# Patient Record
Sex: Male | Born: 1960 | Race: White | Hispanic: No | Marital: Married | Smoking: Never smoker
Health system: Southern US, Community
[De-identification: ages and names within clinical notes are randomized; demographics above are authoritative.]

## PROBLEM LIST (undated history)

## (undated) DIAGNOSIS — M545 Low back pain, unspecified: Secondary | ICD-10-CM

## (undated) HISTORY — DX: Low back pain, unspecified: M54.50

## (undated) HISTORY — PX: POLYPECTOMY: SHX149

## (undated) HISTORY — PX: WISDOM TOOTH EXTRACTION: SHX21

---

## 2004-06-13 ENCOUNTER — Ambulatory Visit (HOSPITAL_COMMUNITY): Admission: RE | Admit: 2004-06-13 | Discharge: 2004-06-13 | Payer: Self-pay | Admitting: Internal Medicine

## 2007-04-28 ENCOUNTER — Ambulatory Visit: Payer: Self-pay | Admitting: Family Medicine

## 2007-11-14 ENCOUNTER — Telehealth: Payer: Self-pay | Admitting: Internal Medicine

## 2008-01-10 ENCOUNTER — Emergency Department (HOSPITAL_COMMUNITY): Admission: EM | Admit: 2008-01-10 | Discharge: 2008-01-10 | Payer: Self-pay | Admitting: Emergency Medicine

## 2008-05-24 ENCOUNTER — Ambulatory Visit: Payer: Self-pay | Admitting: Internal Medicine

## 2008-05-24 ENCOUNTER — Telehealth: Payer: Self-pay | Admitting: Internal Medicine

## 2008-05-24 DIAGNOSIS — H6980 Other specified disorders of Eustachian tube, unspecified ear: Secondary | ICD-10-CM

## 2008-05-24 DIAGNOSIS — T6391XA Toxic effect of contact with unspecified venomous animal, accidental (unintentional), initial encounter: Secondary | ICD-10-CM | POA: Insufficient documentation

## 2009-08-20 ENCOUNTER — Telehealth: Payer: Self-pay | Admitting: Internal Medicine

## 2009-08-30 ENCOUNTER — Ambulatory Visit: Payer: Self-pay | Admitting: Internal Medicine

## 2009-08-30 DIAGNOSIS — L02619 Cutaneous abscess of unspecified foot: Secondary | ICD-10-CM | POA: Insufficient documentation

## 2009-08-30 DIAGNOSIS — L03039 Cellulitis of unspecified toe: Secondary | ICD-10-CM

## 2009-08-30 DIAGNOSIS — L600 Ingrowing nail: Secondary | ICD-10-CM | POA: Insufficient documentation

## 2010-12-07 HISTORY — PX: COLONOSCOPY: SHX174

## 2011-01-22 ENCOUNTER — Telehealth: Payer: Self-pay | Admitting: Internal Medicine

## 2011-01-22 NOTE — Telephone Encounter (Signed)
Has not been seen in over 1 year- h e needs to make an appointment for a physical and will give referral for colonoscopy then

## 2011-01-22 NOTE — Telephone Encounter (Signed)
Pt called and is req to get a colonoscopy scheduled. Pls call.

## 2011-01-27 NOTE — Telephone Encounter (Signed)
[email or web address]

## 2011-02-20 ENCOUNTER — Encounter (INDEPENDENT_AMBULATORY_CARE_PROVIDER_SITE_OTHER): Payer: Self-pay | Admitting: *Deleted

## 2011-02-20 ENCOUNTER — Other Ambulatory Visit: Payer: BC Managed Care – PPO

## 2011-02-20 ENCOUNTER — Other Ambulatory Visit: Payer: Self-pay | Admitting: Internal Medicine

## 2011-02-20 DIAGNOSIS — Z0389 Encounter for observation for other suspected diseases and conditions ruled out: Secondary | ICD-10-CM

## 2011-02-20 DIAGNOSIS — Z0000 Encounter for general adult medical examination without abnormal findings: Secondary | ICD-10-CM

## 2011-02-20 LAB — CBC WITH DIFFERENTIAL/PLATELET
Basophils Absolute: 0 10*3/uL (ref 0.0–0.1)
Eosinophils Absolute: 0.1 10*3/uL (ref 0.0–0.7)
Eosinophils Relative: 1.8 % (ref 0.0–5.0)
HCT: 44.5 % (ref 39.0–52.0)
Hemoglobin: 15.1 g/dL (ref 13.0–17.0)
Lymphocytes Relative: 42.9 % (ref 12.0–46.0)
MCHC: 33.9 g/dL (ref 30.0–36.0)
Monocytes Relative: 7 % (ref 3.0–12.0)
Neutro Abs: 1.9 10*3/uL (ref 1.4–7.7)
Neutrophils Relative %: 48 % (ref 43.0–77.0)

## 2011-02-20 LAB — URINALYSIS
Bilirubin Urine: NEGATIVE
Hgb urine dipstick: NEGATIVE
Ketones, ur: NEGATIVE
Specific Gravity, Urine: 1.01 (ref 1.000–1.030)
Total Protein, Urine: NEGATIVE
Urine Glucose: NEGATIVE
pH: 6 (ref 5.0–8.0)

## 2011-02-20 LAB — TSH: TSH: 1.4 u[IU]/mL (ref 0.35–5.50)

## 2011-02-20 LAB — BASIC METABOLIC PANEL
BUN: 11 mg/dL (ref 6–23)
CO2: 32 mEq/L (ref 19–32)
Chloride: 102 mEq/L (ref 96–112)
Creatinine, Ser: 1.1 mg/dL (ref 0.4–1.5)
Glucose, Bld: 83 mg/dL (ref 70–99)
Potassium: 4.2 mEq/L (ref 3.5–5.1)

## 2011-02-20 LAB — LIPID PANEL: Triglycerides: 137 mg/dL (ref 0.0–149.0)

## 2011-02-20 LAB — PSA: PSA: 0.77 ng/mL (ref 0.10–4.00)

## 2011-02-20 LAB — HEPATIC FUNCTION PANEL
ALT: 28 U/L (ref 0–53)
Total Bilirubin: 0.6 mg/dL (ref 0.3–1.2)

## 2011-02-26 ENCOUNTER — Encounter: Payer: Self-pay | Admitting: Internal Medicine

## 2011-02-27 ENCOUNTER — Ambulatory Visit (INDEPENDENT_AMBULATORY_CARE_PROVIDER_SITE_OTHER): Payer: BC Managed Care – PPO | Admitting: Internal Medicine

## 2011-02-27 ENCOUNTER — Encounter: Payer: Self-pay | Admitting: Internal Medicine

## 2011-02-27 DIAGNOSIS — Z23 Encounter for immunization: Secondary | ICD-10-CM

## 2011-02-27 DIAGNOSIS — Z136 Encounter for screening for cardiovascular disorders: Secondary | ICD-10-CM

## 2011-02-27 DIAGNOSIS — Z0000 Encounter for general adult medical examination without abnormal findings: Secondary | ICD-10-CM

## 2011-02-27 NOTE — Progress Notes (Signed)
  Subjective:    Patient ID: Francisco Hester, male    DOB: 14-Mar-1961, 50 y.o.   MRN: [id number]  HPI patient is a healthy 50 year old white male presents for routine physical examination he has no complaints other than occasional hemorrhoids and his problem list is significant only for eustachian tube dysfunction from chronic allergic rhinitis    Review of Systems  Constitutional: Negative for fever and fatigue.  HENT: Negative for hearing loss, congestion, neck pain and postnasal drip.   Eyes: Negative for discharge, redness and visual disturbance.  Respiratory: Negative for cough, shortness of breath and wheezing.   Cardiovascular: Negative for leg swelling.  Gastrointestinal: Negative for abdominal pain, constipation and abdominal distention.  Genitourinary: Negative for urgency and frequency.  Musculoskeletal: Negative for joint swelling and arthralgias.  Skin: Negative for color change and rash.  Neurological: Negative for weakness and light-headedness.  Hematological: Negative for adenopathy.  Psychiatric/Behavioral: Negative for behavioral problems.   Past Medical History  Diagnosis Date  . Hemorrhoids    Past Surgical History  Procedure Date  . Excisional hemorrhoidectomy     reports that he has never smoked. He does not have any smokeless tobacco history on file. He reports that he does not drink alcohol or use illicit drugs. family history includes COPD in his brother and mother and Dementia in his father. Not on File     Objective:   Physical Exam  Constitutional: He is oriented to person, place, and time. He appears well-developed and well-nourished.  HENT:  Head: Normocephalic and atraumatic.  Eyes: Conjunctivae are normal. Pupils are equal, round, and reactive to light.  Neck: Normal range of motion. Neck supple.  Cardiovascular: Normal rate and regular rhythm.   Pulmonary/Chest: Effort normal and breath sounds normal.  Abdominal: Soft. Bowel sounds are  normal.  Genitourinary: Prostate normal and penis normal.        Mild internal hemorrhoids  Musculoskeletal: Normal range of motion.  Neurological: He is alert and oriented to person, place, and time.  Skin: Skin is warm and dry.  Psychiatric: His behavior is normal.          Assessment & Plan:   Patient presents for yearly preventative medicine examination.   all immunizations and health maintenance protocols were reviewed with the patient and they are up to date with these protocols.   screening laboratory values were reviewed with the patient including screening of hyperlipidemia PSA renal function and hepatic function.   There medications past medical history social history problem list and allergies were reviewed in detail.   Goals were established with regard to weight loss exercise diet in compliance with medications

## 2011-03-04 ENCOUNTER — Encounter: Payer: Self-pay | Admitting: Gastroenterology

## 2011-03-10 NOTE — Letter (Signed)
Summary: Pre Visit Letter Revised  [facility]  [address] [place], [place] [address]   Phone: [phone]  Fax: [phone]        03/04/2011 MRN: [id number]  HALBERT JESSON [address] [place], [place]  [address]  [place]             Procedure Date: 04-17-11 9:30am           Dr Russella Dar   Direct Colon    Welcome to the Gastroenterology Division at [facility].    You are scheduled to see a nurse for your pre-procedure visit on 04-03-11 at 3:30pm on the 3rd floor at [facility], [address]  We ask that you try to arrive at our office 15 minutes prior to your appointment time to allow for check-in.  Please take a minute to review the attached form.  If you answer "Yes" to one or more of the questions on the first page, we ask that you call the person listed at your earliest opportunity.  If you answer "No" to all of the questions, please complete the rest of the form and bring it to your appointment.    Your nurse visit will consist of discussing your medical and surgical history, your immediate family medical history, and your medications.   If you are unable to list all of your medications on the form, please bring the medication bottles to your appointment and we will list them.  We will need to be aware of both prescribed and over the counter drugs.  We will need to know exact dosage information as well.    Please be prepared to read and sign documents such as consent forms, a financial agreement, and acknowledgement forms.  If necessary, and with your consent, a friend or relative is welcome to sit-in on the nurse visit with you.  Please bring your insurance card so that we may make a copy of it.  If your insurance requires a referral to see a specialist, please bring your referral form from your primary care physician.  No co-pay is required for this nurse visit.     If you cannot keep your appointment, please call [phone] to cancel or reschedule  prior to your appointment date.  This allows [place] the opportunity to schedule an appointment for another patient in need of care.    Thank you for choosing [facility] for your medical needs.  We appreciate the opportunity to care for you.  Please visit [place] at our website  to learn more about our practice.  Sincerely, The Gastroenterology Division

## 2011-03-27 ENCOUNTER — Telehealth: Payer: Self-pay | Admitting: *Deleted

## 2011-03-27 DIAGNOSIS — K649 Unspecified hemorrhoids: Secondary | ICD-10-CM

## 2011-03-27 MED ORDER — HYDROCORTISONE ACE-PRAMOXINE 1-1 % RE CREA: TOPICAL_CREAM | Freq: Two times a day (BID) | RECTAL | Status: AC

## 2011-03-27 NOTE — Telephone Encounter (Signed)
Sent to [facility] pharmacy 

## 2011-03-27 NOTE — Telephone Encounter (Signed)
Needs refill for Analpram HC cream for hemorrhoids.

## 2011-04-03 ENCOUNTER — Ambulatory Visit (AMBULATORY_SURGERY_CENTER): Payer: BC Managed Care – PPO | Admitting: *Deleted

## 2011-04-03 VITALS — Ht 72.0 in | Wt 211.5 lb

## 2011-04-03 DIAGNOSIS — Z1211 Encounter for screening for malignant neoplasm of colon: Secondary | ICD-10-CM

## 2011-04-03 MED ORDER — PEG-KCL-NACL-NASULF-NA ASC-C 100 G PO SOLR: 1.0000 | Freq: Once | ORAL | Status: AC

## 2011-04-17 ENCOUNTER — Ambulatory Visit (AMBULATORY_SURGERY_CENTER): Payer: BC Managed Care – PPO | Admitting: Gastroenterology

## 2011-04-17 ENCOUNTER — Encounter: Payer: Self-pay | Admitting: Gastroenterology

## 2011-04-17 VITALS — BP 140/88 | HR 70 | Temp 98.0°F | Resp 20 | Ht 72.0 in | Wt 211.0 lb

## 2011-04-17 DIAGNOSIS — K648 Other hemorrhoids: Secondary | ICD-10-CM

## 2011-04-17 DIAGNOSIS — D126 Benign neoplasm of colon, unspecified: Secondary | ICD-10-CM

## 2011-04-17 DIAGNOSIS — Z1211 Encounter for screening for malignant neoplasm of colon: Secondary | ICD-10-CM

## 2011-04-17 MED ORDER — SODIUM CHLORIDE 0.9 % IV SOLN: 500.0000 mL | INTRAVENOUS | Status: DC

## 2011-04-17 NOTE — Patient Instructions (Signed)
Please read the handouts given to you by your recovery room nurse   Resume your routine medications.  We will contact you by mail as to when to repeat your colonoscopy in 2 weeks.  If you have any questions or concerns, please call us at [phone]. Thank-you.

## 2011-04-20 ENCOUNTER — Telehealth: Payer: Self-pay | Admitting: *Deleted

## 2011-04-20 NOTE — Telephone Encounter (Signed)
Pt unavailable. Spoke with pt's son, Loyal and was given cell phone number to call. No answer on cell phone, no identifier, no message left.

## 2011-04-24 ENCOUNTER — Encounter: Payer: Self-pay | Admitting: Gastroenterology

## 2011-08-28 LAB — DIFFERENTIAL
Basophils Absolute: 0
Lymphocytes Relative: 2 — ABNORMAL LOW
Lymphs Abs: 0.2 — ABNORMAL LOW
Monocytes Absolute: 0.3
Monocytes Relative: 4
Neutro Abs: 6.5
Neutrophils Relative %: 93 — ABNORMAL HIGH

## 2011-08-28 LAB — COMPREHENSIVE METABOLIC PANEL
ALT: 42
AST: 36
Alkaline Phosphatase: 56
Calcium: 9.4
Chloride: 102
Glucose, Bld: 122 — ABNORMAL HIGH
Sodium: 138
Total Bilirubin: 0.9

## 2011-08-28 LAB — CBC
Hemoglobin: 15.6
MCHC: 35.6
MCV: 89.7
RBC: 4.9
WBC: 6.9

## 2011-08-28 LAB — URINALYSIS, ROUTINE W REFLEX MICROSCOPIC
Glucose, UA: NEGATIVE
Hgb urine dipstick: NEGATIVE
Ketones, ur: NEGATIVE
Nitrite: NEGATIVE
Protein, ur: 30 — AB
Urobilinogen, UA: 0.2

## 2012-02-22 ENCOUNTER — Other Ambulatory Visit (INDEPENDENT_AMBULATORY_CARE_PROVIDER_SITE_OTHER): Payer: BC Managed Care – PPO

## 2012-02-22 ENCOUNTER — Other Ambulatory Visit: Payer: Self-pay | Admitting: *Deleted

## 2012-02-22 ENCOUNTER — Telehealth: Payer: Self-pay | Admitting: *Deleted

## 2012-02-22 DIAGNOSIS — Z0000 Encounter for general adult medical examination without abnormal findings: Secondary | ICD-10-CM

## 2012-02-22 LAB — CBC WITH DIFFERENTIAL/PLATELET
Basophils Absolute: 0 10*3/uL (ref 0.0–0.1)
Basophils Relative: 0.3 % (ref 0.0–3.0)
HCT: 46.1 % (ref 39.0–52.0)
Hemoglobin: 15.8 g/dL (ref 13.0–17.0)
Lymphs Abs: 2 10*3/uL (ref 0.7–4.0)
MCHC: 34.2 g/dL (ref 30.0–36.0)
Monocytes Relative: 8.3 % (ref 3.0–12.0)
Neutro Abs: 2.4 10*3/uL (ref 1.4–7.7)
RBC: 4.98 Mil/uL (ref 4.22–5.81)
RDW: 12.8 % (ref 11.5–14.6)

## 2012-02-22 LAB — LIPID PANEL
HDL: 38.1 mg/dL — ABNORMAL LOW (ref >39.00)
Triglycerides: 166 mg/dL — ABNORMAL HIGH (ref 0.0–149.0)

## 2012-02-22 LAB — BASIC METABOLIC PANEL
Calcium: 9.7 mg/dL (ref 8.4–10.5)
GFR: 68.83 mL/min (ref >60.00)
Potassium: 4.7 mEq/L (ref 3.5–5.1)
Sodium: 140 mEq/L (ref 135–145)

## 2012-02-22 LAB — TSH: TSH: 2.14 u[IU]/mL (ref 0.35–5.50)

## 2012-02-22 LAB — HEPATIC FUNCTION PANEL
ALT: 26 U/L (ref 0–53)
Total Protein: 6.4 g/dL (ref 6.0–8.3)

## 2012-02-22 LAB — PSA: PSA: 0.82 ng/mL (ref 0.10–4.00)

## 2012-02-22 NOTE — Telephone Encounter (Signed)
done

## 2012-02-29 ENCOUNTER — Encounter: Payer: BC Managed Care – PPO | Admitting: Internal Medicine

## 2012-03-14 ENCOUNTER — Ambulatory Visit (INDEPENDENT_AMBULATORY_CARE_PROVIDER_SITE_OTHER): Payer: BC Managed Care – PPO | Admitting: Internal Medicine

## 2012-03-14 ENCOUNTER — Encounter: Payer: Self-pay | Admitting: Internal Medicine

## 2012-03-14 VITALS — BP 144/80 | HR 72 | Temp 98.2°F | Resp 16 | Ht 72.0 in | Wt 208.0 lb

## 2012-03-14 DIAGNOSIS — Z0000 Encounter for general adult medical examination without abnormal findings: Secondary | ICD-10-CM

## 2012-03-14 DIAGNOSIS — E785 Hyperlipidemia, unspecified: Secondary | ICD-10-CM

## 2012-03-14 NOTE — Progress Notes (Signed)
Subjective:    Patient ID: Francisco Hester, male    DOB: July 21, 1961, 51 y.o.   MRN: [id number]  HPI  CPX  Caring for mother and father with memory issues  Review of Systems  Constitutional: Negative for fever and fatigue.  HENT: Negative for hearing loss, congestion, neck pain and postnasal drip.   Eyes: Negative for discharge, redness and visual disturbance.  Respiratory: Negative for cough, shortness of breath and wheezing.   Cardiovascular: Negative for leg swelling.  Gastrointestinal: Negative for abdominal pain, constipation and abdominal distention.  Genitourinary: Negative for urgency and frequency.  Musculoskeletal: Negative for joint swelling and arthralgias.  Skin: Negative for color change and rash.  Neurological: Negative for weakness and light-headedness.  Hematological: Negative for adenopathy.  Psychiatric/Behavioral: Negative for behavioral problems.   Past Medical History  Diagnosis Date  . Hemorrhoids     History   Social History  . Marital Status: Married    Spouse Name: N/A    Number of Children: N/A  . Years of Education: N/A   Occupational History  . Not on file.   Social History Main Topics  . Smoking status: Never Smoker   . Smokeless tobacco: Not on file  . Alcohol Use: No  . Drug Use: No  . Sexually Active: Yes   Other Topics Concern  . Not on file   Social History Narrative  . No narrative on file    Past Surgical History  Procedure Date  . Wisdom tooth extraction     Family History  Problem Relation Age of Onset  . COPD Mother   . Dementia Father   . COPD Brother     No Known Allergies  Current Outpatient Prescriptions on File Prior to Visit  Medication Sig Dispense Refill  . Multiple Vitamin (MULTIVITAMIN) tablet Take 1 tablet by mouth daily.        . Omega-3 Fatty Acids (GNP FISH OIL) 1200 MG CPDR Take 1,200 mg by mouth daily.         Current Facility-Administered Medications on File Prior to Visit  Medication  Dose Route Frequency Provider Last Rate Last Dose  . DISCONTD: 0.9 %  sodium chloride infusion  500 mL Intravenous Continuous Meryl Dare, MD,FACG        BP 144/80  Pulse 72  Temp 98.2 F (36.8 C)  Resp 16  Ht 6' (1.829 m)  Wt 208 lb (94.348 kg)  BMI 28.21 kg/m2       Objective:   Physical Exam  Nursing note and vitals reviewed. Constitutional: He is oriented to person, place, and time. He appears well-developed and well-nourished.  HENT:  Head: Normocephalic and atraumatic.  Eyes: Conjunctivae are normal. Pupils are equal, round, and reactive to light.  Neck: Normal range of motion. Neck supple.  Cardiovascular: Normal rate and regular rhythm.   Pulmonary/Chest: Effort normal and breath sounds normal.  Abdominal: Soft. Bowel sounds are normal.  Genitourinary: Rectum normal and prostate normal.  Musculoskeletal: Normal range of motion.  Neurological: He is alert and oriented to person, place, and time.  Skin: Skin is warm and dry.  Psychiatric: He has a normal mood and affect. His behavior is normal.          Assessment & Plan:   Patient presents for yearly preventative medicine examination.   all immunizations and health maintenance protocols were reviewed with the patient and they are up to date with these protocols.   screening laboratory values were reviewed with the  patient including screening of hyperlipidemia PSA renal function and hepatic function.   There medications past medical history social history problem list and allergies were reviewed in detail.   Goals were established with regard to weight loss exercise diet in compliance with medications This is a mild interval elevation of cholesterol but I believe can be adjusted by diet

## 2012-03-14 NOTE — Patient Instructions (Addendum)
The patient is instructed to continue all medications as prescribed. Schedule followup with check out clerk upon leaving the clinic Cholesterol Control Diet Cholesterol levels in your body are determined significantly by your diet. Cholesterol levels may also be related to heart disease. The following material helps to explain this relationship and discusses what you can do to help keep your heart healthy. Not all cholesterol is bad. Low-density lipoprotein (LDL) cholesterol is the "bad" cholesterol. It may cause fatty deposits to build up inside your arteries. High-density lipoprotein (HDL) cholesterol is "good." It helps to remove the "bad" LDL cholesterol from your blood. Cholesterol is a very important risk factor for heart disease. Other risk factors are high blood pressure, smoking, stress, heredity, and weight. The heart muscle gets its supply of blood through the coronary arteries. If your LDL cholesterol is high and your HDL cholesterol is low, you are at risk for having fatty deposits build up in your coronary arteries. This leaves less room through which blood can flow. Without sufficient blood and oxygen, the heart muscle cannot function properly and you may feel chest pains (angina pectoris). When a coronary artery closes up entirely, a part of the heart muscle may die, causing a heart attack (myocardial infarction). CHECKING CHOLESTEROL When your caregiver sends your blood to a lab to be analyzed for cholesterol, a complete lipid (fat) profile may be done. With this test, the total amount of cholesterol and levels of LDL and HDL are determined. Triglycerides are a type of fat that circulates in the blood and can also be used to determine heart disease risk. The list below describes what the numbers should be: Test: Total Cholesterol.  Less than 200 mg/dl.  Test: LDL "bad cholesterol."  Less than 100 mg/dl.   Less than 70 mg/dl if you are at very high risk of a heart attack or sudden  cardiac death.  Test: HDL "good cholesterol."  Greater than 50 mg/dl for women.   Greater than 40 mg/dl for men.  Test: Triglycerides.  Less than 150 mg/dl.  CONTROLLING CHOLESTEROL WITH DIET Although exercise and lifestyle factors are important, your diet is key. That is because certain foods are known to raise cholesterol and others to lower it. The goal is to balance foods for their effect on cholesterol and more importantly, to replace saturated and trans fat with other types of fat, such as monounsaturated fat, polyunsaturated fat, and omega-3 fatty acids. On average, a person should consume no more than 15 to 17 g of saturated fat daily. Saturated and trans fats are considered "bad" fats, and they will raise LDL cholesterol. Saturated fats are primarily found in animal products such as meats, butter, and cream. However, that does not mean you need to sacrifice all your favorite foods. Today, there are good tasting, low-fat, low-cholesterol substitutes for most of the things you like to eat. Choose low-fat or nonfat alternatives. Choose round or loin cuts of red meat, since these types of cuts are lowest in fat and cholesterol. Chicken (without the skin), fish, veal, and ground [place] breast are excellent choices. Eliminate fatty meats, such as hot dogs and salami. Even shellfish have little or no saturated fat. Have a 3 oz (85 g) portion when you eat lean meat, poultry, or fish. Trans fats are also called "partially hydrogenated oils." They are oils that have been scientifically manipulated so that they are solid at room temperature resulting in a longer shelf life and improved taste and texture of foods in which  they are added. Trans fats are found in stick margarine, some tub margarines, cookies, crackers, and baked goods.  When baking and cooking, oils are an excellent substitute for butter. The monounsaturated oils are especially beneficial since it is believed they lower LDL and raise HDL.  The oils you should avoid entirely are saturated tropical oils, such as coconut and palm.  Remember to eat liberally from food groups that are naturally free of saturated and trans fat, including fish, fruit, vegetables, beans, grains (barley, rice, couscous, bulgur wheat), and pasta (without cream sauces).  IDENTIFYING FOODS THAT LOWER CHOLESTEROL  Soluble fiber may lower your cholesterol. This type of fiber is found in fruits such as apples, vegetables such as broccoli, potatoes, and carrots, legumes such as beans, peas, and lentils, and grains such as barley. Foods fortified with plant sterols (phytosterol) may also lower cholesterol. You should eat at least 2 g per day of these foods for a cholesterol lowering effect.  Read package labels to identify low-saturated fats, trans fats free, and low-fat foods at the supermarket. Select cheeses that have only 2 to 3 g saturated fat per ounce. Use a heart-healthy tub margarine that is free of trans fats or partially hydrogenated oil. When buying baked goods (cookies, crackers), avoid partially hydrogenated oils. Breads and muffins should be made from whole grains (whole-wheat or whole oat flour, instead of "flour" or "enriched flour"). Buy non-creamy canned soups with reduced salt and no added fats.  FOOD PREPARATION TECHNIQUES  Never deep-fry. If you must fry, either stir-fry, which uses very little fat, or use non-stick cooking sprays. When possible, broil, bake, or roast meats, and steam vegetables. Instead of dressing vegetables with butter or margarine, use lemon and herbs, applesauce and cinnamon (for squash and sweet potatoes), nonfat yogurt, salsa, and low-fat dressings for salads.  LOW-SATURATED FAT / LOW-FAT FOOD SUBSTITUTES Meats / Saturated Fat (g)  Avoid: Steak, marbled (3 oz/85 g) / 11 g   Choose: Steak, lean (3 oz/85 g) / 4 g   Avoid: Hamburger (3 oz/85 g) / 7 g   Choose: Hamburger, lean (3 oz/85 g) / 5 g   Avoid: Ham (3 oz/85 g) / 6 g     Choose: Ham, lean cut (3 oz/85 g) / 2.4 g   Avoid: Chicken, with skin, dark meat (3 oz/85 g) / 4 g   Choose: Chicken, skin removed, dark meat (3 oz/85 g) / 2 g   Avoid: Chicken, with skin, light meat (3 oz/85 g) / 2.5 g   Choose: Chicken, skin removed, light meat (3 oz/85 g) / 1 g  Dairy / Saturated Fat (g)  Avoid: Whole milk (1 cup) / 5 g   Choose: Low-fat milk, 2% (1 cup) / 3 g   Choose: Low-fat milk, 1% (1 cup) / 1.5 g   Choose: Skim milk (1 cup) / 0.3 g   Avoid: Hard cheese (1 oz/28 g) / 6 g   Choose: Skim milk cheese (1 oz/28 g) / 2 to 3 g   Avoid: Cottage cheese, 4% fat (1 cup) / 6.5 g   Choose: Low-fat cottage cheese, 1% fat (1 cup) / 1.5 g   Avoid: Ice cream (1 cup) / 9 g   Choose: Sherbet (1 cup) / 2.5 g   Choose: Nonfat frozen yogurt (1 cup) / 0.3 g   Choose: Frozen fruit bar / trace   Avoid: Whipped cream (1 tbs) / 3.5 g   Choose: Nondairy whipped topping (1 tbs) /  1 g  Condiments / Saturated Fat (g)  Avoid: Mayonnaise (1 tbs) / 2 g   Choose: Low-fat mayonnaise (1 tbs) / 1 g   Avoid: Butter (1 tbs) / 7 g   Choose: Extra light margarine (1 tbs) / 1 g   Avoid: Coconut oil (1 tbs) / 11.8 g   Choose: Olive oil (1 tbs) / 1.8 g   Choose: Corn oil (1 tbs) / 1.7 g   Choose: Safflower oil (1 tbs) / 1.2 g   Choose: Sunflower oil (1 tbs) / 1.4 g   Choose: Soybean oil (1 tbs) / 2.4 g   Choose: Canola oil (1 tbs) / 1 g  Document Released: 11/23/2005 Document Revised: 11/12/2011 Document Reviewed: 05/14/2011 [facility] Patient Information 2012 [place], [place].

## 2012-09-13 ENCOUNTER — Other Ambulatory Visit: Payer: BC Managed Care – PPO

## 2012-09-19 ENCOUNTER — Ambulatory Visit: Payer: BC Managed Care – PPO | Admitting: Internal Medicine

## 2013-01-16 ENCOUNTER — Telehealth: Payer: Self-pay | Admitting: Internal Medicine

## 2013-01-16 NOTE — Telephone Encounter (Signed)
Patient Information:  Caller Name: T[place]on  Phone: [phone]  Patient: Francisco Hester, Francisco Hester  Gender: Male  DOB: 1961-10-21  Age: 52 Years  PCP: Darryll Capers (Adults only)  Office Follow Up:  Does the office need to follow up with this patient?: No  Instructions For The Office: N/A  RN Note:  Rash with pink bumps and scaly skin that is itchy to circumcised area of penis and scrotal area.  minimal to no improvement with Lotrimen cream.  Symptoms  Reason For Call & Symptoms: Itching and burning in genital area with scaly area  Reviewed Health History In EMR: Yes  Reviewed Medications In EMR: Yes  Reviewed Allergies In EMR: Yes  Reviewed Surgeries / Procedures: Yes  Date of Onset of Symptoms: 12/26/2012  Treatments Tried: Lotrimen  Treatments Tried Worked: Yes  Guideline(s) Used:  Jock Itch  Penis and Scrotum Symptoms  Disposition Per Guideline:   See Today or Tomorrow in Office  Reason For Disposition Reached:   Painless rash (e.g., redness, tiny bumps, sore) present > 24 hours  Advice Given:  Causes of Mild Rash:  Irritation from a chemical product: perfumed soaps, latex condoms.  Small friction burns can occur from intercourse (if inadequate lubrication).  Call Back If:  You become worse.  Appointment Scheduled:  01/17/2013 11:15:00 Appointment Scheduled Provider:  Adline Mango [facility])

## 2013-01-17 ENCOUNTER — Encounter: Payer: Self-pay | Admitting: Family

## 2013-01-17 ENCOUNTER — Ambulatory Visit (INDEPENDENT_AMBULATORY_CARE_PROVIDER_SITE_OTHER): Payer: BC Managed Care – PPO | Admitting: Family

## 2013-01-17 VITALS — BP 128/82 | HR 61 | Wt 193.0 lb

## 2013-01-17 DIAGNOSIS — B356 Tinea cruris: Secondary | ICD-10-CM

## 2013-01-17 MED ORDER — CLOTRIMAZOLE-BETAMETHASONE 1-0.05 % EX CREA: TOPICAL_CREAM | Freq: Two times a day (BID) | CUTANEOUS | Status: DC

## 2013-01-17 NOTE — Progress Notes (Signed)
  Subjective:    Patient ID: Francisco Hester, male    DOB: 07/22/1961, 52 y.o.   MRN: [id number]  HPI 52 year old white male, nonsmoker, patient of Dr. Lovell Sheehan is in today with complaints of a rash on his penis x4 weeks that he describes as itchy, red, and burns. Appears raw. Has been applying Neosporin and Lotrimin inconsistently and is not really sure that helped. Patient noticed the rash after having intercourse or lubrication was a problem. 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   Review of Systems  Constitutional: Negative.   Respiratory: Negative.   Cardiovascular: Negative.   Genitourinary: Negative.  Negative for dysuria.  Musculoskeletal: Negative.   Skin: Positive for rash.       Rash on penis  Psychiatric/Behavioral: Negative.    Past Medical History  Diagnosis Date  . Hemorrhoids     History   Social History  . Marital Status: Married    Spouse Name: N/A    Number of Children: N/A  . Years of Education: N/A   Occupational History  . Not on file.   Social History Main Topics  . Smoking status: Never Smoker   . Smokeless tobacco: Not on file  . Alcohol Use: No  . Drug Use: No  . Sexually Active: Yes   Other Topics Concern  . Not on file   Social History Narrative  . No narrative on file    Past Surgical History  Procedure Laterality Date  . Wisdom tooth extraction      Family History  Problem Relation Age of Onset  . COPD Mother   . Dementia Father   . COPD Brother     No Known Allergies  Current Outpatient Prescriptions on File Prior to Visit  Medication Sig Dispense Refill  . Multiple Vitamin (MULTIVITAMIN) tablet Take 1 tablet by mouth daily.        . Omega-3 Fatty Acids (GNP FISH OIL) 1200 MG CPDR Take 1,200 mg by mouth daily.         No current facility-administered medications on file prior to visit.    BP 128/82  Pulse 61  Wt 193 lb (87.544 kg)  BMI 26.17 kg/m2  SpO2 97%chart    Objective:   Physical Exam   Constitutional: He is oriented to person, place, and time. He appears well-developed and well-nourished.  Cardiovascular: Normal rate, regular rhythm and normal heart sounds.   Pulmonary/Chest: Effort normal and breath sounds normal.  Neurological: He is alert and oriented to person, place, and time.  Skin: Skin is warm and dry. Rash noted.  Red, waxy appearing rash noted to the distal aspect of the penis. Mildly tender to touch. No drainage or discharge from the penis. No lymphadenopathy.  Psychiatric: He has a normal mood and affect.          Assessment & Plan:  Assessment: 1. Tinea of the skin  Plan: Lotrisone cream applied to the affected area twice a day. Keep area clean and dry. Patient advised to call the office if symptoms worsen or persist. Recheck a schedule, and as needed.

## 2013-01-17 NOTE — Patient Instructions (Addendum)
Yeast Infection of the Skin Some yeast on the skin is normal, but sometimes it causes an infection. If you have a yeast infection, it shows up as white or light brown patches on brown skin. You can see it better in the summer on tan skin. It causes light-colored holes in your suntan. It can happen on any area of the body. This cannot be passed from person to person. HOME CARE  Scrub your skin daily with a dandruff shampoo. Your rash may take a couple weeks to get well.  Do not scratch or itch the rash. GET HELP RIGHT AWAY IF:   You get another infection from scratching. The skin may get warm, red, and may ooze fluid.  The infection does not seem to be getting better. MAKE SURE YOU:  Understand these instructions.  Will watch your condition.  Will get help right away if you are not doing well or get worse. Document Released: 11/05/2008 Document Revised: 02/15/2012 Document Reviewed: 11/05/2008 [facility] Patient Information 2013 [place], [place].

## 2013-05-02 ENCOUNTER — Other Ambulatory Visit (INDEPENDENT_AMBULATORY_CARE_PROVIDER_SITE_OTHER): Payer: BC Managed Care – PPO

## 2013-05-02 DIAGNOSIS — E785 Hyperlipidemia, unspecified: Secondary | ICD-10-CM

## 2013-05-02 DIAGNOSIS — Z0000 Encounter for general adult medical examination without abnormal findings: Secondary | ICD-10-CM

## 2013-05-02 LAB — BASIC METABOLIC PANEL
BUN: 15 mg/dL (ref 6–23)
Chloride: 102 mEq/L (ref 96–112)
Glucose, Bld: 80 mg/dL (ref 70–99)
Potassium: 4.1 mEq/L (ref 3.5–5.1)
Sodium: 139 mEq/L (ref 135–145)

## 2013-05-02 LAB — POCT URINALYSIS DIPSTICK
Bilirubin, UA: NEGATIVE
Blood, UA: NEGATIVE
Glucose, UA: NEGATIVE
Ketones, UA: NEGATIVE
Spec Grav, UA: <=1.005

## 2013-05-02 LAB — HEPATIC FUNCTION PANEL
ALT: 26 U/L (ref 0–53)
AST: 26 U/L (ref 0–37)
Albumin: 4.1 g/dL (ref 3.5–5.2)
Alkaline Phosphatase: 49 U/L (ref 39–117)

## 2013-05-02 LAB — TSH: TSH: 1.31 u[IU]/mL (ref 0.35–5.50)

## 2013-05-02 LAB — CBC WITH DIFFERENTIAL/PLATELET
Basophils Relative: 0.4 % (ref 0.0–3.0)
Eosinophils Relative: 1.6 % (ref 0.0–5.0)
HCT: 43.7 % (ref 39.0–52.0)
Hemoglobin: 15 g/dL (ref 13.0–17.0)
Lymphs Abs: 1.6 10*3/uL (ref 0.7–4.0)
MCV: 91.8 fl (ref 78.0–100.0)
Monocytes Absolute: 0.3 10*3/uL (ref 0.1–1.0)
Monocytes Relative: 7 % (ref 3.0–12.0)
Neutro Abs: 2.1 10*3/uL (ref 1.4–7.7)
Platelets: 123 10*3/uL — ABNORMAL LOW (ref 150.0–400.0)
WBC: 4 10*3/uL — ABNORMAL LOW (ref 4.5–10.5)

## 2013-05-02 LAB — LIPID PANEL
Cholesterol: 169 mg/dL (ref 0–200)
LDL Cholesterol: 109 mg/dL — ABNORMAL HIGH (ref 0–99)

## 2013-05-08 ENCOUNTER — Ambulatory Visit (INDEPENDENT_AMBULATORY_CARE_PROVIDER_SITE_OTHER): Payer: BC Managed Care – PPO | Admitting: Internal Medicine

## 2013-05-08 ENCOUNTER — Encounter: Payer: Self-pay | Admitting: Internal Medicine

## 2013-05-08 VITALS — BP 144/86 | HR 68 | Temp 98.2°F | Resp 16 | Ht 72.0 in | Wt 196.0 lb

## 2013-05-08 DIAGNOSIS — Z0000 Encounter for general adult medical examination without abnormal findings: Secondary | ICD-10-CM

## 2013-05-08 NOTE — Progress Notes (Signed)
Subjective:    Patient ID: Francisco Hester, male    DOB: [birth year], 52 y.o.   MRN: [id number]  HPI  CPX  Review of Systems  Constitutional: Negative for fever and fatigue.  HENT: Negative for hearing loss, congestion, neck pain and postnasal drip.   Eyes: Negative for discharge, redness and visual disturbance.  Respiratory: Negative for cough, shortness of breath and wheezing.   Cardiovascular: Negative for leg swelling.  Gastrointestinal: Negative for abdominal pain, constipation and abdominal distention.  Genitourinary: Negative for urgency and frequency.  Musculoskeletal: Negative for joint swelling and arthralgias.  Skin: Negative for color change and rash.  Neurological: Negative for weakness and light-headedness.  Hematological: Negative for adenopathy.  Psychiatric/Behavioral: Negative for behavioral problems.       Past Medical History  Diagnosis Date  . Hemorrhoids     History   Social History  . Marital Status: Married    Spouse Name: N/A    Number of Children: N/A  . Years of Education: N/A   Occupational History  . Not on file.   Social History Main Topics  . Smoking status: Never Smoker   . Smokeless tobacco: Not on file  . Alcohol Use: No  . Drug Use: No  . Sexually Active: Yes   Other Topics Concern  . Not on file   Social History Narrative  . No narrative on file    Past Surgical History  Procedure Laterality Date  . Wisdom tooth extraction      Family History  Problem Relation Age of Onset  . COPD Mother   . Dementia Father   . COPD Brother     No Known Allergies  Current Outpatient Prescriptions on File Prior to Visit  Medication Sig Dispense Refill  . clotrimazole-betamethasone (LOTRISONE) cream Apply topically 2 (two) times daily.  30 g  0  . Omega-3 Fatty Acids (GNP FISH OIL) 1200 MG CPDR Take 1,200 mg by mouth daily.        . Multiple Vitamin (MULTIVITAMIN) tablet Take 1 tablet by mouth daily.         No current  facility-administered medications on file prior to visit.    BP 144/86  Pulse 68  Temp(Src) 98.2 F (36.8 C)  Resp 16  Ht 6' (1.829 m)  Wt 196 lb (88.905 kg)  BMI 26.58 kg/m2    Objective:   Physical Exam  Nursing note and vitals reviewed. Constitutional: He is oriented to person, place, and time. He appears well-developed and well-nourished.  HENT:  Head: Normocephalic and atraumatic.  Eyes: Conjunctivae are normal. Pupils are equal, round, and reactive to light.  Neck: Normal range of motion. Neck supple.  Cardiovascular: Normal rate and regular rhythm.   Pulmonary/Chest: Effort normal and breath sounds normal.  Abdominal: Soft. Bowel sounds are normal.  Neurological: He is alert and oriented to person, place, and time.  Skin: Skin is warm and dry.  Psychiatric: He has a normal mood and affect. His behavior is normal.          Assessment & Plan:    Patient presents for yearly preventative medicine examination.   all immunizations and health maintenance protocols were reviewed with the patient and they are up to date with these protocols.   screening laboratory values were reviewed with the patient including screening of hyperlipidemia PSA renal function and hepatic function.   There medications past medical history social history problem list and allergies were reviewed in detail.   Goals were established  with regard to weight loss exercise diet in compliance with medications

## 2013-10-12 ENCOUNTER — Other Ambulatory Visit: Payer: Self-pay

## 2014-01-03 ENCOUNTER — Telehealth: Payer: Self-pay | Admitting: Internal Medicine

## 2014-01-03 NOTE — Telephone Encounter (Signed)
Patient Information:  Caller Name: Otoniel  Phone: [phone]  Patient: Francisco Hester, Francisco Hester  Gender: Male  DOB: 15-May-1961  Age: 53 Years  PCP: Benay Pillow (Adults only)  Office Follow Up:  Does the office need to follow up with this patient?: No  Instructions For The Office: N/A   Symptoms  Reason For Call & Symptoms: Pt calling asking for an anitbiotic for sinus congestion and pain for "several weeks now". Afebrile. Not treating. Explained that antibiotic could not be called in w/out appt.l  Reviewed Health History In EMR: Yes  Reviewed Medications In EMR: Yes  Reviewed Allergies In EMR: Yes  Reviewed Surgeries / Procedures: Yes  Date of Onset of Symptoms: 01/03/2014  Guideline(s) Used:  Sinus Pain and Congestion  Disposition Per Guideline:   See Today in Office  Reason For Disposition Reached:   Sinus pain (not just congestion) and fever  Advice Given:  N/A  Patient Will Follow Care Advice:  YES  Appointment Scheduled: No appts avail for 01/03/14.   01/04/2014 15:30:00 Appointment Scheduled Provider:  Carolann Littler (Family Practice)

## 2014-01-04 ENCOUNTER — Encounter: Payer: Self-pay | Admitting: Family Medicine

## 2014-01-04 ENCOUNTER — Ambulatory Visit (INDEPENDENT_AMBULATORY_CARE_PROVIDER_SITE_OTHER): Payer: BC Managed Care – PPO | Admitting: Family Medicine

## 2014-01-04 VITALS — BP 128/90 | HR 60 | Temp 97.7°F | Wt 210.0 lb

## 2014-01-04 DIAGNOSIS — J019 Acute sinusitis, unspecified: Secondary | ICD-10-CM

## 2014-01-04 MED ORDER — AZITHROMYCIN 250 MG PO TABS: ORAL_TABLET | ORAL | Status: AC

## 2014-01-04 NOTE — Progress Notes (Signed)
Pre visit review using our clinic review tool, if applicable. No additional management support is needed unless otherwise documented below in the visit note. 

## 2014-01-04 NOTE — Progress Notes (Signed)
   Subjective:    Patient ID: Francisco Hester, male    DOB: 1961-08-29, 53 y.o.   MRN: [id number]  HPI Patient seen with 3 week history of right facial pain maxillary and frontal region. Had some right ear pressure. Increased malaise. Yellow-green nasal discharge. Denies any fever. Intermittent mild headaches. Denies any cough. He is continued exercise. He's tried saline nasal irrigation without improvement  Past Medical History  Diagnosis Date  . Hemorrhoids    Past Surgical History  Procedure Laterality Date  . Wisdom tooth extraction      reports that he has never smoked. He does not have any smokeless tobacco history on file. He reports that he does not drink alcohol or use illicit drugs. family history includes COPD in his brother and mother; Dementia in his father. No Known Allergies      Review of Systems  Constitutional: Negative for fever and chills.  HENT: Positive for congestion and sinus pressure.   Respiratory: Negative for cough.        Objective:   Physical Exam  Constitutional: He appears well-developed and well-nourished.  HENT:  Right Ear: External ear normal.  Left Ear: External ear normal.  Mouth/Throat: Oropharynx is clear and moist.  Erythematous nasal mucosa. Thick yellow mucus bilaterally  Neck: Neck supple.  Cardiovascular: Normal rate.   Pulmonary/Chest: Effort normal and breath sounds normal. No respiratory distress. He has no wheezes. He has no rales.  Lymphadenopathy:    He has no cervical adenopathy.          Assessment & Plan:  Acute sinusitis. Given duration of symptoms, start Zithromax for 5 days. Try humidifier at night. Continue saline nasal irrigation

## 2014-05-04 ENCOUNTER — Other Ambulatory Visit (INDEPENDENT_AMBULATORY_CARE_PROVIDER_SITE_OTHER): Payer: BC Managed Care – PPO

## 2014-05-04 DIAGNOSIS — Z0000 Encounter for general adult medical examination without abnormal findings: Secondary | ICD-10-CM

## 2014-05-04 LAB — BASIC METABOLIC PANEL
BUN: 17 mg/dL (ref 6–23)
CO2: 32 meq/L (ref 19–32)
CREATININE: 1.1 mg/dL (ref 0.4–1.5)
Calcium: 9.6 mg/dL (ref 8.4–10.5)
Chloride: 101 mEq/L (ref 96–112)
GFR: 76.42 mL/min (ref >60.00)
Glucose, Bld: 77 mg/dL (ref 70–99)
Potassium: 4.1 mEq/L (ref 3.5–5.1)
Sodium: 139 mEq/L (ref 135–145)

## 2014-05-04 LAB — CBC WITH DIFFERENTIAL/PLATELET
Basophils Absolute: 0 10*3/uL (ref 0.0–0.1)
Basophils Relative: 0.4 % (ref 0.0–3.0)
EOS ABS: 0 10*3/uL (ref 0.0–0.7)
Eosinophils Relative: 1.1 % (ref 0.0–5.0)
HCT: 44.9 % (ref 39.0–52.0)
Hemoglobin: 15.2 g/dL (ref 13.0–17.0)
LYMPHS PCT: 37.3 % (ref 12.0–46.0)
Lymphs Abs: 1.3 10*3/uL (ref 0.7–4.0)
MCHC: 33.8 g/dL (ref 30.0–36.0)
MCV: 91.9 fl (ref 78.0–100.0)
Monocytes Absolute: 0.3 10*3/uL (ref 0.1–1.0)
Monocytes Relative: 7.4 % (ref 3.0–12.0)
NEUTROS PCT: 53.8 % (ref 43.0–77.0)
Neutro Abs: 1.9 10*3/uL (ref 1.4–7.7)
PLATELETS: 133 10*3/uL — AB (ref 150.0–400.0)
RBC: 4.89 Mil/uL (ref 4.22–5.81)
RDW: 12.9 % (ref 11.5–15.5)
WBC: 3.5 10*3/uL — ABNORMAL LOW (ref 4.0–10.5)

## 2014-05-04 LAB — HEPATIC FUNCTION PANEL
ALK PHOS: 52 U/L (ref 39–117)
ALT: 46 U/L (ref 0–53)
AST: 29 U/L (ref 0–37)
Albumin: 4.3 g/dL (ref 3.5–5.2)
BILIRUBIN DIRECT: 0.1 mg/dL (ref 0.0–0.3)
BILIRUBIN TOTAL: 0.9 mg/dL (ref 0.2–1.2)
Total Protein: 6.3 g/dL (ref 6.0–8.3)

## 2014-05-04 LAB — POCT URINALYSIS DIPSTICK
BILIRUBIN UA: NEGATIVE
Blood, UA: NEGATIVE
Glucose, UA: NEGATIVE
KETONES UA: NEGATIVE
LEUKOCYTES UA: NEGATIVE
Nitrite, UA: NEGATIVE
Protein, UA: NEGATIVE
Spec Grav, UA: 1.01
Urobilinogen, UA: 0.2
pH, UA: 7

## 2014-05-04 LAB — PSA: PSA: 1.15 ng/mL (ref 0.10–4.00)

## 2014-05-04 LAB — TSH: TSH: 1.73 u[IU]/mL (ref 0.35–4.50)

## 2014-05-04 LAB — LIPID PANEL
Cholesterol: 187 mg/dL (ref 0–200)
HDL: 45 mg/dL (ref >39.00)
LDL Cholesterol: 126 mg/dL — ABNORMAL HIGH (ref 0–99)
Total CHOL/HDL Ratio: 4
Triglycerides: 78 mg/dL (ref 0.0–149.0)
VLDL: 15.6 mg/dL (ref 0.0–40.0)

## 2014-05-11 ENCOUNTER — Encounter: Payer: Self-pay | Admitting: Internal Medicine

## 2014-05-11 ENCOUNTER — Ambulatory Visit (INDEPENDENT_AMBULATORY_CARE_PROVIDER_SITE_OTHER): Payer: BC Managed Care – PPO | Admitting: Internal Medicine

## 2014-05-11 VITALS — BP 180/110 | HR 64 | Temp 98.2°F | Ht 72.25 in | Wt 198.0 lb

## 2014-05-11 DIAGNOSIS — Z0000 Encounter for general adult medical examination without abnormal findings: Secondary | ICD-10-CM

## 2014-05-11 NOTE — Progress Notes (Signed)
Pre visit review using our clinic review tool, if applicable. No additional management support is needed unless otherwise documented below in the visit note. 

## 2014-05-11 NOTE — Patient Instructions (Signed)
The patient is instructed to continue all medications as prescribed. Schedule followup with check out clerk upon leaving the clinic  

## 2014-05-11 NOTE — Progress Notes (Signed)
   Subjective:    Patient ID: Francisco Hester, male    DOB: [birth year], 53 y.o.   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  HENT: Negative for congestion, hearing loss and postnasal drip.   Eyes: Negative for discharge, redness and visual disturbance.  Respiratory: Negative for cough, shortness of breath and wheezing.   Cardiovascular: Negative for leg swelling.  Gastrointestinal: Negative for abdominal pain, constipation and abdominal distention.  Genitourinary: Negative for urgency and frequency.  Musculoskeletal: Negative for arthralgias, joint swelling and neck pain.  Skin: Negative for color change and rash.  Neurological: Negative for weakness and light-headedness.  Hematological: Negative for adenopathy.  Psychiatric/Behavioral: Negative for behavioral problems.  All other systems reviewed and are negative.      Objective:   Physical Exam  Constitutional: He is oriented to person, place, and time. He appears well-developed and well-nourished.  HENT:  Head: Normocephalic and atraumatic.  Eyes: Conjunctivae are normal. Pupils are equal, round, and reactive to light.  Neck: Normal range of motion. Neck supple.  Cardiovascular: Normal rate and regular rhythm.   Pulmonary/Chest: Effort normal and breath sounds normal.  Abdominal: Soft. Bowel sounds are normal.  Musculoskeletal: Normal range of motion.  Neurological: He is alert and oriented to person, place, and time.  Skin: Skin is warm and dry.  Psychiatric: He has a normal mood and affect. His behavior is normal.          Assessment & Plan:  Patient presents for yearly preventative medicine examination. ROS questionnaire was completed  All immunizations and health maintenance  protocols were reviewed with the patient and needed orders were placed.  Appropriate screening laboratory values were ordered for the patient including screening of hyperlipidemia, renal function and hepatic function. If indicated by BPH, a PSA was ordered.  Medication reconciliation,  past medical history, social history, problem list and allergies were reviewed in detail with the patient  Goals were established with regard to weight loss, exercise, and  diet in compliance with medications  End of life planning was discussed.  White coat : repeat BP was 140/77

## 2014-12-15 ENCOUNTER — Emergency Department (HOSPITAL_COMMUNITY): Payer: BLUE CROSS/BLUE SHIELD

## 2014-12-15 ENCOUNTER — Encounter (HOSPITAL_COMMUNITY): Payer: Self-pay | Admitting: Emergency Medicine

## 2014-12-15 ENCOUNTER — Emergency Department (HOSPITAL_COMMUNITY)
Admission: EM | Admit: 2014-12-15 | Discharge: 2014-12-15 | Disposition: A | Discharge status: Home / Self Care | Payer: BLUE CROSS/BLUE SHIELD | Attending: Emergency Medicine | Admitting: Emergency Medicine

## 2014-12-15 DIAGNOSIS — R002 Palpitations: Secondary | ICD-10-CM | POA: Diagnosis present

## 2014-12-15 DIAGNOSIS — Z8719 Personal history of other diseases of the digestive system: Secondary | ICD-10-CM | POA: Diagnosis not present

## 2014-12-15 DIAGNOSIS — F419 Anxiety disorder, unspecified: Secondary | ICD-10-CM | POA: Insufficient documentation

## 2014-12-15 LAB — CBC WITH DIFFERENTIAL/PLATELET
Basophils Absolute: 0 10*3/uL (ref 0.0–0.1)
Basophils Relative: 0 % (ref 0–1)
Eosinophils Absolute: 0.1 10*3/uL (ref 0.0–0.7)
Eosinophils Relative: 1 % (ref 0–5)
HCT: 44.2 % (ref 39.0–52.0)
Hemoglobin: 15.6 g/dL (ref 13.0–17.0)
Lymphocytes Relative: 27 % (ref 12–46)
Lymphs Abs: 1.5 10*3/uL (ref 0.7–4.0)
MCH: 31.3 pg (ref 26.0–34.0)
MCHC: 35.3 g/dL (ref 30.0–36.0)
MCV: 88.6 fL (ref 78.0–100.0)
Monocytes Absolute: 0.3 10*3/uL (ref 0.1–1.0)
Monocytes Relative: 5 % (ref 3–12)
NEUTROS PCT: 67 % (ref 43–77)
Neutro Abs: 3.7 10*3/uL (ref 1.7–7.7)
Platelets: 143 10*3/uL — ABNORMAL LOW (ref 150–400)
RBC: 4.99 MIL/uL (ref 4.22–5.81)
RDW: 12.3 % (ref 11.5–15.5)
WBC: 5.6 10*3/uL (ref 4.0–10.5)

## 2014-12-15 LAB — TROPONIN I

## 2014-12-15 LAB — COMPREHENSIVE METABOLIC PANEL
ALT: 32 U/L (ref 0–53)
ANION GAP: 6 (ref 5–15)
AST: 34 U/L (ref 0–37)
Albumin: 4.9 g/dL (ref 3.5–5.2)
Alkaline Phosphatase: 58 U/L (ref 39–117)
BILIRUBIN TOTAL: 0.7 mg/dL (ref 0.3–1.2)
BUN: 14 mg/dL (ref 6–23)
CO2: 31 mmol/L (ref 19–32)
Calcium: 9.9 mg/dL (ref 8.4–10.5)
Chloride: 105 mEq/L (ref 96–112)
Creatinine, Ser: 1.1 mg/dL (ref 0.50–1.35)
GFR calc non Af Amer: 74 mL/min — ABNORMAL LOW (ref >90)
GFR, EST AFRICAN AMERICAN: 86 mL/min — AB (ref >90)
Glucose, Bld: 101 mg/dL — ABNORMAL HIGH (ref 70–99)
Potassium: 4.1 mmol/L (ref 3.5–5.1)
Sodium: 142 mmol/L (ref 135–145)
TOTAL PROTEIN: 7 g/dL (ref 6.0–8.3)

## 2014-12-15 NOTE — Discharge Instructions (Signed)
Recommend follow-up with cardiologist. Phone number given. Please recheck your blood pressure several times and record in a notebook

## 2014-12-15 NOTE — ED Provider Notes (Signed)
CSN: [id number]     Arrival date & time 12/15/14  1716 History  This chart was scribed for Nat Christen, MD by Randa Evens, ED Scribe. This patient was seen in room APA07/APA07 and the patient's care was started at 10:40 PM.      Chief Complaint  Patient presents with  . Palpitations   Patient is a 54 y.o. male presenting with palpitations. The history is provided by the patient. No language interpreter was used.  Palpitations Associated symptoms: no chest pain, no diaphoresis, no nausea and no shortness of breath    HPI Comments: SHINICHI ANGUIANO is a 54 y.o. male who presents to the Emergency Department complaining of new sudden palpitations onset today around 12 PM. Pt states that he felt slightly nervous. Pt states that his pulse felt erratic. Denies nausea, diaphoresis, SOB, or CP.  Pt states that he is not a smoker. Pt denies family Hx of cardiac diease. Denies use of alcohol. No chronic health problems. No medications.   Past Medical History  Diagnosis Date  . Hemorrhoids    Past Surgical History  Procedure Laterality Date  . Wisdom tooth extraction     Family History  Problem Relation Age of Onset  . COPD Mother   . Dementia Father   . COPD Brother    History  Substance Use Topics  . Smoking status: Never Smoker   . Smokeless tobacco: Not on file  . Alcohol Use: No    Review of Systems  Constitutional: Negative for diaphoresis.  Respiratory: Negative for shortness of breath.   Cardiovascular: Positive for palpitations. Negative for chest pain.  Gastrointestinal: Negative for nausea.  Psychiatric/Behavioral: The patient is nervous/anxious.       Allergies  Review of patient's allergies indicates no known allergies.  Home Medications   Prior to Admission medications   Medication Sig Start Date End Date Taking? Authorizing Provider  Biotin 1 MG CAPS Take 1 tablet by mouth daily.    Yes Historical Provider, MD  ibuprofen (ADVIL,MOTRIN) 200 MG tablet Take  200 mg by mouth every 6 (six) hours as needed for mild pain or moderate pain.   Yes Historical Provider, MD  Omega-3 Fatty Acids (GNP FISH OIL) 1200 MG CPDR Take 1,200 mg by mouth daily.     Yes Historical Provider, MD   Triage Vitals: BP 164/97 mmHg  Pulse 70  Temp(Src) 97.8 F (36.6 C) (Oral)  Resp 20  Ht 6' (1.829 m)  Wt 195 lb (88.451 kg)  BMI 26.44 kg/m2  SpO2 100%  Physical Exam  Constitutional: He is oriented to person, place, and time. He appears well-developed and well-nourished.  HENT:  Head: Normocephalic and atraumatic.  Eyes: Conjunctivae and EOM are normal. Pupils are equal, round, and reactive to light.  Neck: Normal range of motion. Neck supple.  Cardiovascular: Normal rate and regular rhythm.   Pulmonary/Chest: Effort normal and breath sounds normal.  Abdominal: Soft. Bowel sounds are normal.  Musculoskeletal: Normal range of motion.  Neurological: He is alert and oriented to person, place, and time.  Skin: Skin is warm and dry.  Psychiatric: He has a normal mood and affect. His behavior is normal.  Nursing note and vitals reviewed.   ED Course  Procedures (including critical care time) DIAGNOSTIC STUDIES: Oxygen Saturation is 100% on RA, normal by my interpretation.    COORDINATION OF CARE: 10:49 PM-Discussed treatment plan with pt at bedside and pt agreed to plan.     Labs Review Labs Reviewed  CBC WITH DIFFERENTIAL - Abnormal; Notable for the following:    Platelets 143 (*)    All other components within normal limits  COMPREHENSIVE METABOLIC PANEL - Abnormal; Notable for the following:    Glucose, Bld 101 (*)    GFR calc non Af Amer 74 (*)    GFR calc Af Amer 86 (*)    All other components within normal limits  TROPONIN I    Imaging Review Dg Chest 2 View  12/15/2014   CLINICAL DATA:  Palpitations  EXAM: CHEST  2 VIEW  COMPARISON:  None.  FINDINGS: Lungs are clear.  No pleural effusion or pneumothorax.  Heart is normal in size.  Visualized  osseous structures are within normal limits.  IMPRESSION: No evidence of acute cardiopulmonary disease.   Electronically Signed   By: Julian Hy M.D.   On: 12/15/2014 18:49     EKG Interpretation   Date/Time:  Saturday December 15 2014 17:51:47 EST Ventricular Rate:  77 PR Interval:  186 QRS Duration: 88 QT Interval:  400 QTC Calculation: 452 R Axis:   -3 Text Interpretation:  Sinus rhythm with Premature atrial complexes  Anterior infarct , age undetermined Abnormal ECG Confirmed by Yomayra Tate  MD,  Devan Danzer ([id number]) on 12/15/2014 6:02:34 PM      MDM   Final diagnoses:  Palpitations   EKG shows normal sinus rhythm with premature atrial complexes. Cardiac risk factor profile is extremely low. Discussed findings with patient and his wife. Recommend cardiology consult. Patient is hemodynamically stable at discharge   I personally performed the services described in this documentation, which was scribed in my presence. The recorded information has been reviewed and is accurate.      Nat Christen, MD 12/15/14 2330

## 2014-12-15 NOTE — ED Notes (Signed)
PT stated he had some palpitations on and off today starting around lunch time. PT denies any SOB or Chest Pain today.

## 2015-01-02 ENCOUNTER — Encounter: Payer: Self-pay | Admitting: Cardiovascular Disease

## 2015-01-02 ENCOUNTER — Ambulatory Visit (INDEPENDENT_AMBULATORY_CARE_PROVIDER_SITE_OTHER): Payer: BLUE CROSS/BLUE SHIELD | Admitting: Cardiovascular Disease

## 2015-01-02 VITALS — BP 143/94 | HR 80 | Ht 72.0 in | Wt 196.0 lb

## 2015-01-02 DIAGNOSIS — R002 Palpitations: Secondary | ICD-10-CM

## 2015-01-02 NOTE — Patient Instructions (Signed)
Your physician recommends that you schedule a follow-up appointment As needed  Your physician recommends that you continue on your current medications as directed. Please refer to the Current Medication list given to you today.  Thank you for choosing [facility]!    

## 2015-01-02 NOTE — Addendum Note (Signed)
Addended by: Levonne Hubert on: 01/02/2015 02:24 PM   Modules accepted: Level of Service
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
## 2015-02-04 ENCOUNTER — Encounter: Payer: Self-pay | Admitting: Family Medicine

## 2015-02-04 ENCOUNTER — Ambulatory Visit (INDEPENDENT_AMBULATORY_CARE_PROVIDER_SITE_OTHER): Payer: BLUE CROSS/BLUE SHIELD | Admitting: Family Medicine

## 2015-02-04 VITALS — BP 132/88 | Ht 72.25 in | Wt 197.5 lb

## 2015-02-04 DIAGNOSIS — R002 Palpitations: Secondary | ICD-10-CM | POA: Diagnosis not present

## 2015-02-04 DIAGNOSIS — E785 Hyperlipidemia, unspecified: Secondary | ICD-10-CM | POA: Diagnosis not present

## 2015-02-04 NOTE — Progress Notes (Signed)
   Subjective:    Patient ID: Francisco Hester, male    DOB: September 12, 1961, 54 y.o.   MRN: [id number]  HPI Patient is here today to establish care. Patient also wanted to follow up on the palpitations he experienced about 1 month ago. He was seen by cardiology concerning this.    ha intermittent palpitations. No syncope. No tachycardia. Denies sweats chills nausea vomiting. ddenies chest pressure tightness r pain  Review of Systems  Constitutional: Negative for activity change, appetite change and fatigue.  HENT: Negative for congestion.   Respiratory: Negative for cough.   Cardiovascular: Negative for chest pain.  Gastrointestinal: Negative for abdominal pain.  Endocrine: Negative for polydipsia and polyphagia.  Neurological: Negative for weakness.  Psychiatric/Behavioral: Negative for confusion.       Objective:   Physical Exam  Constitutional: He appears well-nourished. No distress.  Cardiovascular: Normal rate, regular rhythm and normal heart sounds.   No murmur heard. Pulmonary/Chest: Effort normal and breath sounds normal. No respiratory distress.  Musculoskeletal: He exhibits no edema.  Lymphadenopathy:    He has no cervical adenopathy.  Neurological: He is alert.  Psychiatric: His behavior is normal.  Vitals reviewed.         Assessment & Plan:   premature atrial contractions there is no need to do any type of the extensive cardiac workup he's Arty seen the cardiologist I reviewed over everything I believe he is doing well his exercise good his diets good he will need comprehensive lab work in the early spring with a follow-up wellness visit in May.  He was counseled to minimize caffeine's in the diet and get proper rest he was also counseled how to check a blood pressure as well as heart rate he will bring his blood pressure monitor with him when he follows up. Should he have any problems he will notify us.

## 2015-02-20 ENCOUNTER — Telehealth: Payer: Self-pay | Admitting: Family Medicine

## 2015-02-20 NOTE — Telephone Encounter (Signed)
Pt would like for you to call him this evening about his father Due to some issues with him at his nursing home

## 2015-02-20 NOTE — Telephone Encounter (Signed)
Pt has been having frequent UTIs. Going to the ER. Is in a wheelchair most of the time. Unable to come to [facility]. They want to transfer him out of [facility] into a skilled living center. Francisco Hester wants your opinion.

## 2015-02-21 NOTE — Telephone Encounter (Signed)
Spoke with son , they will be initiating care with Doctor group at the center

## 2015-04-30 ENCOUNTER — Telehealth: Payer: Self-pay | Admitting: Family Medicine

## 2015-04-30 DIAGNOSIS — R5383 Other fatigue: Secondary | ICD-10-CM

## 2015-04-30 DIAGNOSIS — E785 Hyperlipidemia, unspecified: Secondary | ICD-10-CM

## 2015-04-30 DIAGNOSIS — Z125 Encounter for screening for malignant neoplasm of prostate: Secondary | ICD-10-CM

## 2015-04-30 DIAGNOSIS — Z79899 Other long term (current) drug therapy: Secondary | ICD-10-CM

## 2015-04-30 NOTE — Telephone Encounter (Signed)
Pt is requesting lab orders to be sent over for his June 6th appt. Last labs per epic were: cbc,cmet,and troponin I on 12/15/14

## 2015-05-01 NOTE — Telephone Encounter (Signed)
CBC, metabolic 7, lipid, PSA-hyperlipidemia, neutropenia, screening

## 2015-05-01 NOTE — Telephone Encounter (Signed)
Left message on voicemail notifying patient that blood work has been ordered.  

## 2015-05-05 LAB — BASIC METABOLIC PANEL
BUN/Creatinine Ratio: 11 (ref 9–20)
BUN: 12 mg/dL (ref 6–24)
CALCIUM: 9.9 mg/dL (ref 8.7–10.2)
CO2: 27 mmol/L (ref 18–29)
Chloride: 99 mmol/L (ref 97–108)
Creatinine, Ser: 1.07 mg/dL (ref 0.76–1.27)
GFR calc Af Amer: 90 mL/min/{1.73_m2} (ref >59)
GFR calc non Af Amer: 78 mL/min/{1.73_m2} (ref >59)
Glucose: 94 mg/dL (ref 65–99)
Potassium: 4.6 mmol/L (ref 3.5–5.2)
Sodium: 143 mmol/L (ref 134–144)

## 2015-05-05 LAB — CBC WITH DIFFERENTIAL/PLATELET
Basophils Absolute: 0 10*3/uL (ref 0.0–0.2)
Basos: 1 %
EOS (ABSOLUTE): 0.1 10*3/uL (ref 0.0–0.4)
Eos: 1 %
HEMOGLOBIN: 16.6 g/dL (ref 12.6–17.7)
Hematocrit: 47.3 % (ref 37.5–51.0)
IMMATURE GRANS (ABS): 0 10*3/uL (ref 0.0–0.1)
IMMATURE GRANULOCYTES: 0 %
Lymphocytes Absolute: 1.6 10*3/uL (ref 0.7–3.1)
Lymphs: 40 %
MCH: 31.4 pg (ref 26.6–33.0)
MCHC: 35.1 g/dL (ref 31.5–35.7)
MCV: 90 fL (ref 79–97)
MONOCYTES: 8 %
MONOS ABS: 0.3 10*3/uL (ref 0.1–0.9)
NEUTROS ABS: 2.1 10*3/uL (ref 1.4–7.0)
Neutrophils: 50 %
PLATELETS: 160 10*3/uL (ref 150–379)
RBC: 5.28 x10E6/uL (ref 4.14–5.80)
RDW: 13.3 % (ref 12.3–15.4)
WBC: 4.2 10*3/uL (ref 3.4–10.8)

## 2015-05-05 LAB — LIPID PANEL
Chol/HDL Ratio: 4.1 ratio units (ref 0.0–5.0)
Cholesterol, Total: 193 mg/dL (ref 100–199)
HDL: 47 mg/dL (ref >39)
LDL Calculated: 117 mg/dL — ABNORMAL HIGH (ref 0–99)
Triglycerides: 144 mg/dL (ref 0–149)
VLDL Cholesterol Cal: 29 mg/dL (ref 5–40)

## 2015-05-05 LAB — PSA: Prostate Specific Ag, Serum: 1.5 ng/mL (ref 0.0–4.0)

## 2015-05-13 ENCOUNTER — Encounter: Payer: Self-pay | Admitting: Family Medicine

## 2015-05-13 ENCOUNTER — Ambulatory Visit (INDEPENDENT_AMBULATORY_CARE_PROVIDER_SITE_OTHER): Payer: BLUE CROSS/BLUE SHIELD | Admitting: Family Medicine

## 2015-05-13 VITALS — BP 130/80 | Ht 73.0 in | Wt 195.5 lb

## 2015-05-13 DIAGNOSIS — R358 Other polyuria: Secondary | ICD-10-CM

## 2015-05-13 DIAGNOSIS — Z0000 Encounter for general adult medical examination without abnormal findings: Secondary | ICD-10-CM | POA: Diagnosis not present

## 2015-05-13 DIAGNOSIS — R3589 Other polyuria: Secondary | ICD-10-CM

## 2015-05-13 LAB — POCT URINALYSIS DIPSTICK: pH, UA: 7

## 2015-05-13 NOTE — Progress Notes (Signed)
   Subjective:    Patient ID: Francisco Hester, male    DOB: 1961-11-22, 54 y.o.   MRN: [id number]  HPI The patient comes in today for a wellness visit.    A review of their health history was completed.  A review of medications was also completed.  Any needed refills: none  Eating habits: good   Falls/  MVA accidents in past few months: none   Regular exercise: good  Specialist pt sees on regular basis: none  Preventative health issues were discussed.   Additional concerns: none   Review of Systems  Constitutional: Negative for activity change, appetite change and fatigue.  HENT: Negative for congestion.   Respiratory: Negative for cough.   Cardiovascular: Negative for chest pain.  Gastrointestinal: Negative for abdominal pain.  Endocrine: Negative for polydipsia and polyphagia.  Neurological: Negative for weakness.  Psychiatric/Behavioral: Negative for confusion.       Objective:   Physical Exam  Constitutional: He appears well-nourished. No distress.  Cardiovascular: Normal rate, regular rhythm and normal heart sounds.   No murmur heard. Pulmonary/Chest: Effort normal and breath sounds normal. No respiratory distress.  Musculoskeletal: He exhibits no edema.  Lymphadenopathy:    He has no cervical adenopathy.  Neurological: He is alert.  Psychiatric: His behavior is normal.  Vitals reviewed.   prostate exam completed soft no hard nodules  blood pressure slightly elevated here it is the same on his monitor her  his monitor gets very good readings at home this points toward no hypertension      Assessment & Plan:   safety , dietary all discussed. Mild hyperlipidemia does not need to be on medicine  patient with complaint of increased urination urine looks good no sign of diabetes on bloodwork  PSA looks good monitor yearly wellness exam yearly  Patient exercises has occasional palpitation has been cleared by cardiology earlier this year  PSA slowly  rising but not at an alarming rate

## 2015-09-17 ENCOUNTER — Telehealth: Payer: Self-pay

## 2015-09-17 MED ORDER — NAPROXEN 500 MG PO TABS: 500.0000 mg | ORAL_TABLET | Freq: Two times a day (BID) | ORAL | Status: DC

## 2015-09-17 NOTE — Telephone Encounter (Signed)
Notified patient patient that the safest anti-inflammatory would be naproxen 500 mg 1 twice a day, #30, gentle stretching exercises, hold off on ibuprofen., If progressive pain or starches radiating down his legs he may well need to follow-up for evaluation. Med sent to pharmacy. Patient verbalized understanding. Back exercises sent in the mail.

## 2015-09-17 NOTE — Telephone Encounter (Signed)
Let patient know that the safest anti-inflammatory would be naproxen 500 mg 1 twice a day, #30, gentle stretching exercises, hold off on ibuprofen., If progressive pain or starches radiating down his legs he may well need to follow-up for evaluation, offer to mail patient information on back exercises please

## 2015-09-17 NOTE — Telephone Encounter (Signed)
Patient is having some back pain that started this morning. He states that he believes he strained it a little because of his job and he lifts weights a lot. He wants to know if he can have an anti-inflammatory sent to pharmacy because he is taking a lot of ibuprofen. He is having no other symptoms. [place]

## 2016-02-13 ENCOUNTER — Encounter: Payer: Self-pay | Admitting: Gastroenterology

## 2016-04-20 ENCOUNTER — Other Ambulatory Visit: Payer: Self-pay | Admitting: *Deleted

## 2016-04-20 ENCOUNTER — Encounter: Payer: Self-pay | Admitting: Gastroenterology

## 2016-04-20 ENCOUNTER — Telehealth: Payer: Self-pay | Admitting: Family Medicine

## 2016-04-20 DIAGNOSIS — E785 Hyperlipidemia, unspecified: Secondary | ICD-10-CM

## 2016-04-20 DIAGNOSIS — Z125 Encounter for screening for malignant neoplasm of prostate: Secondary | ICD-10-CM

## 2016-04-20 DIAGNOSIS — R5383 Other fatigue: Secondary | ICD-10-CM

## 2016-04-20 NOTE — Telephone Encounter (Signed)
Pt is requesting lab orders to be sent over for an upcoming wellness visit. Last labs per epic were: lipid,bmp,cbc,and psa on 05/04/15

## 2016-04-20 NOTE — Telephone Encounter (Signed)
Please repeat the exact same labs from 1 year ago thank you

## 2016-04-20 NOTE — Telephone Encounter (Signed)
Left message notifiying pt that orders are ready.

## 2016-05-08 LAB — LIPID PANEL
CHOL/HDL RATIO: 3.8 ratio (ref 0.0–5.0)
CHOLESTEROL TOTAL: 165 mg/dL (ref 100–199)
HDL: 43 mg/dL (ref >39)
LDL Calculated: 97 mg/dL (ref 0–99)
Triglycerides: 126 mg/dL (ref 0–149)
VLDL Cholesterol Cal: 25 mg/dL (ref 5–40)

## 2016-05-08 LAB — BASIC METABOLIC PANEL
BUN/Creatinine Ratio: 13 (ref 9–20)
BUN: 14 mg/dL (ref 6–24)
CALCIUM: 9.6 mg/dL (ref 8.7–10.2)
CHLORIDE: 99 mmol/L (ref 96–106)
CO2: 27 mmol/L (ref 18–29)
Creatinine, Ser: 1.09 mg/dL (ref 0.76–1.27)
GFR calc non Af Amer: 75 mL/min/{1.73_m2} (ref >59)
GFR, EST AFRICAN AMERICAN: 87 mL/min/{1.73_m2} (ref >59)
Glucose: 90 mg/dL (ref 65–99)
POTASSIUM: 4.3 mmol/L (ref 3.5–5.2)
Sodium: 141 mmol/L (ref 134–144)

## 2016-05-08 LAB — CBC WITH DIFFERENTIAL/PLATELET
Basophils Absolute: 0 10*3/uL (ref 0.0–0.2)
Basos: 1 %
EOS (ABSOLUTE): 0.2 10*3/uL (ref 0.0–0.4)
Eos: 6 %
HEMOGLOBIN: 15.4 g/dL (ref 12.6–17.7)
Hematocrit: 44.8 % (ref 37.5–51.0)
IMMATURE GRANS (ABS): 0 10*3/uL (ref 0.0–0.1)
Immature Granulocytes: 0 %
LYMPHS: 35 %
Lymphocytes Absolute: 1.3 10*3/uL (ref 0.7–3.1)
MCH: 30.6 pg (ref 26.6–33.0)
MCHC: 34.4 g/dL (ref 31.5–35.7)
MCV: 89 fL (ref 79–97)
Monocytes Absolute: 0.3 10*3/uL (ref 0.1–0.9)
Monocytes: 8 %
NEUTROS ABS: 1.8 10*3/uL (ref 1.4–7.0)
Neutrophils: 50 %
PLATELETS: 136 10*3/uL — AB (ref 150–379)
RBC: 5.03 x10E6/uL (ref 4.14–5.80)
RDW: 13.1 % (ref 12.3–15.4)
WBC: 3.6 10*3/uL (ref 3.4–10.8)

## 2016-05-08 LAB — PSA: PROSTATE SPECIFIC AG, SERUM: 2 ng/mL (ref 0.0–4.0)

## 2016-05-12 ENCOUNTER — Ambulatory Visit (INDEPENDENT_AMBULATORY_CARE_PROVIDER_SITE_OTHER): Payer: Managed Care, Other (non HMO) | Admitting: Family Medicine

## 2016-05-12 ENCOUNTER — Encounter: Payer: Self-pay | Admitting: Family Medicine

## 2016-05-12 VITALS — BP 128/86 | Ht 73.0 in | Wt 195.5 lb

## 2016-05-12 DIAGNOSIS — D696 Thrombocytopenia, unspecified: Secondary | ICD-10-CM

## 2016-05-12 DIAGNOSIS — N400 Enlarged prostate without lower urinary tract symptoms: Secondary | ICD-10-CM | POA: Diagnosis not present

## 2016-05-12 DIAGNOSIS — Z0000 Encounter for general adult medical examination without abnormal findings: Secondary | ICD-10-CM | POA: Diagnosis not present

## 2016-05-12 NOTE — Progress Notes (Signed)
   Subjective:    Patient ID: Francisco Hester, male    DOB: 1961-01-16, 55 y.o.   MRN: [id number]  HPI The patient comes in today for a wellness visit.    A review of their health history was completed.  A review of medications was also completed.  Any needed refills; None  Eating habits: Patient states eating habits are good. Limits fats in diet.  Falls/  MVA accidents in past few months: None  Regular exercise: Patient states runs 3-4 days a week and lifts weights 3-4 days a week.  Specialist pt sees on regular basis: None Preventative health issues were discussed.   Additional concerns: Patient states no additional concerns this visit.  Patient relates safety. Is retiring later this year. Review of Systems Patient denies no inhibition of flow. States does not get up at night to urinate.   he denies any chest tightness pressure pain shortness breath denies indigestion rectal bleeding hematuria denies headaches denies joint pains. Hope I reviewed over his lab work in detail. Overall looks good. Platelets do run low but this is chronic for him. Not causing any problems. I do recommend rechecking his lab work on a yearly basis. Objective:   Physical Exam Lungs are clear hearts regular pulse normal BP good abdomen soft extremities no edema skin warm dry neurologic grossly normal Prostate exam slightly enlarged no hard nodules felt      Assessment & Plan:  Adult wellness-complete.wellness physical was conducted today. Importance of diet and exercise were discussed in detail. In addition to this a discussion regarding safety was also covered. We also reviewed over immunizations and gave recommendations regarding current immunization needed for age. In addition to this additional areas were also touched on including: Preventative health exams needed: Colonoscopy Later this year-patient already has an set up with gastroenterology  Should be noted that the patient does have a  mildly enlarged prostate no hard nodules felt PSA is at too I recommend repeat PSA in one years time. Patient was advised yearly wellness exam

## 2016-06-05 ENCOUNTER — Ambulatory Visit (AMBULATORY_SURGERY_CENTER): Payer: BLUE CROSS/BLUE SHIELD

## 2016-06-05 VITALS — Ht 73.0 in | Wt 196.0 lb

## 2016-06-05 DIAGNOSIS — Z86010 Personal history of colonic polyps: Secondary | ICD-10-CM

## 2016-06-05 MED ORDER — NA SULFATE-K SULFATE-MG SULF 17.5-3.13-1.6 GM/177ML PO SOLN: 1.0000 | Freq: Once | ORAL | Status: DC

## 2016-06-05 NOTE — Progress Notes (Signed)
No egg or soy allergy.  No previous complications from anesthesia. No home O2. No diet meds. 

## 2016-06-09 ENCOUNTER — Encounter: Payer: Self-pay | Admitting: Gastroenterology

## 2016-06-19 ENCOUNTER — Encounter: Payer: Self-pay | Admitting: Gastroenterology

## 2016-06-19 ENCOUNTER — Ambulatory Visit (AMBULATORY_SURGERY_CENTER): Payer: Managed Care, Other (non HMO) | Admitting: Gastroenterology

## 2016-06-19 VITALS — BP 114/81 | HR 46 | Temp 98.0°F | Resp 9 | Ht 73.0 in | Wt 196.0 lb

## 2016-06-19 DIAGNOSIS — Z86010 Personal history of colonic polyps: Secondary | ICD-10-CM | POA: Diagnosis present

## 2016-06-19 DIAGNOSIS — D125 Benign neoplasm of sigmoid colon: Secondary | ICD-10-CM

## 2016-06-19 DIAGNOSIS — K635 Polyp of colon: Secondary | ICD-10-CM | POA: Diagnosis not present

## 2016-06-19 DIAGNOSIS — D122 Benign neoplasm of ascending colon: Secondary | ICD-10-CM | POA: Diagnosis not present

## 2016-06-19 MED ORDER — SODIUM CHLORIDE 0.9 % IV SOLN: 500.0000 mL | INTRAVENOUS | Status: DC

## 2016-06-19 NOTE — Op Note (Signed)
[facility] Patient Name: Francisco Hester Procedure Date: 06/19/2016 8:36 AM MRN: [id number] Endoscopist: Ladene Artist , MD Age: 55 Referring MD:  Date of Birth: 1961-11-20 Gender: Male Account #: [id number] Procedure:                Colonoscopy Indications:              Surveillance: Personal history of adenomatous                            polyps on last colonoscopy > 5 years ago Medicines:                Monitored Anesthesia Care Procedure:                Pre-Anesthesia Assessment:                           - Prior to the procedure, a History and Physical                            was performed, and patient medications and                            allergies were reviewed. The patient's tolerance of                            previous anesthesia was also reviewed. The risks                            and benefits of the procedure and the sedation                            options and risks were discussed with the patient.                            All questions were answered, and informed consent                            was obtained. Prior Anticoagulants: The patient has                            taken no previous anticoagulant or antiplatelet                            agents. ASA Grade Assessment: II - A patient with                            mild systemic disease. After reviewing the risks                            and benefits, the patient was deemed in                            satisfactory condition to undergo the procedure.  After obtaining informed consent, the colonoscope                            was passed under direct vision. Throughout the                            procedure, the patient's blood pressure, pulse, and                            oxygen saturations were monitored continuously. The                            Model PCF-H190L [id number]) scope was introduced                            through the anus and  advanced to the the cecum,                            identified by appendiceal orifice and ileocecal                            valve. The ileocecal valve, appendiceal orifice,                            and rectum were photographed. The quality of the                            bowel preparation was good. The colonoscopy was                            performed without difficulty. The patient tolerated                            the procedure well. Scope In: 8:43:02 AM Scope Out: 9:00:05 AM Scope Withdrawal Time: 0 hours 14 minutes 28 seconds  Total Procedure Duration: 0 hours 17 minutes 3 seconds  Findings:                 A 9 mm polyp was found in the ascending colon. The                            polyp was sessile. The polyp was removed with a hot                            snare. Resection and retrieval were complete.                           Internal hemorrhoids were found during                            retroflexion. The hemorrhoids were small and Grade                            I (internal hemorrhoids that do not prolapse).  The exam was otherwise without abnormality on                            direct and retroflexion views.                           A 5 mm polyp was found in the sigmoid colon. The                            polyp was sessile. The polyp was removed with a                            cold snare. Resection and retrieval were complete. Complications:            No immediate complications. Estimated blood loss:                            None. Estimated Blood Loss:     Estimated blood loss: none. Impression:               - One 9 mm polyp in the ascending colon, removed                            with a hot snare. Resected and retrieved.                           - Internal hemorrhoids.                           - One 5 mm polyp in the sigmoid colon, removed with                            a cold snare. Resected and  retrieved. Recommendation:           - Repeat colonoscopy in 5 years for surveillance.                           - Patient has a contact number available for                            emergencies. The signs and symptoms of potential                            delayed complications were discussed with the                            patient. Return to normal activities tomorrow.                            Written discharge instructions were provided to the                            patient.                           -  Resume previous diet.                           - Continue present medications.                           - Await pathology results.                           - No aspirin, ibuprofen, naproxen, or other                            non-steroidal anti-inflammatory drugs for 2 weeks                            after polyp removal. Ladene Artist, MD 06/19/2016 9:04:03 AM This report has been signed electronically.

## 2016-06-19 NOTE — Progress Notes (Signed)
Called to room to assist during endoscopic procedure.  Patient ID and intended procedure confirmed with present staff. Received instructions for my participation in the procedure from the performing physician.  

## 2016-06-19 NOTE — Progress Notes (Signed)
To recovery awake alert vss report to pam rn

## 2016-06-19 NOTE — Patient Instructions (Signed)
YOU HAD AN ENDOSCOPIC PROCEDURE TODAY AT [facility]:   Refer to the procedure report that was given to you for any specific questions about what was found during the examination.  If the procedure report does not answer your questions, please call your gastroenterologist to clarify.  If you requested that your care partner not be given the details of your procedure findings, then the procedure report has been included in a sealed envelope for you to review at your convenience later.  YOU SHOULD EXPECT: Some feelings of bloating in the abdomen. Passage of more gas than usual.  Walking can help get rid of the air that was put into your GI tract during the procedure and reduce the bloating. If you had a lower endoscopy (such as a colonoscopy or flexible sigmoidoscopy) you may notice spotting of blood in your stool or on the toilet paper. If you underwent a bowel prep for your procedure, you may not have a normal bowel movement for a few days.  Please Note:  You might notice some irritation and congestion in your nose or some drainage.  This is from the oxygen used during your procedure.  There is no need for concern and it should clear up in a day or so.  SYMPTOMS TO REPORT IMMEDIATELY:   Following lower endoscopy (colonoscopy or flexible sigmoidoscopy):  Excessive amounts of blood in the stool  Significant tenderness or worsening of abdominal pains  Swelling of the abdomen that is new, acute  Fever of 100F or higher   Physician on call can be reached at any hour by calling [phone].   DIET: Your first meal following the procedure should be a small meal and then it is ok to progress to your normal diet. Heavy or fried foods are harder to digest and may make you feel nauseous or bloated.  Likewise, meals heavy in dairy and vegetables can increase bloating.  Drink plenty of fluids but you should avoid alcoholic beverages for 24 hours.  ACTIVITY:  You should plan to take it  easy for the rest of today and you should NOT DRIVE or use heavy machinery until tomorrow (because of the sedation medicines used during the test).    FOLLOW UP: Our staff will call the number listed on your records the next business day following your procedure to check on you and address any questions or concerns that you may have regarding the information given to you following your procedure. If we do not reach you, we will leave a message.  However, if you are feeling well and you are not experiencing any problems, there is no need to return our call.  We will assume that you have returned to your regular daily activities without incident.  If any biopsies were taken you will be contacted by phone or by letter within the next 1-3 weeks.  Please call us at [phone] if you have not heard about the biopsies in 3 weeks.    SIGNATURES/CONFIDENTIALITY: You and/or your care partner have signed paperwork which will be entered into your electronic medical record.  These signatures attest to the fact that that the information above on your After Visit Summary has been reviewed and is understood.  Full responsibility of the confidentiality of this discharge information lies with you and/or your care-partner.   NO ASPIRIN OR ANT INFLAMMATORY PRODUCTS FOR 2 WEEKS  INFORMATION ON POLYPS AND HEMORRHOIDS GIVEN TO YOU TODAY

## 2016-06-22 ENCOUNTER — Telehealth: Payer: Self-pay

## 2016-06-22 NOTE — Telephone Encounter (Signed)
  Follow up Call-  Call back number 06/19/2016  Post procedure Call Back phone  # [phone]  Permission to leave phone message Yes     Patient questions:  Do you have a fever, pain , or abdominal swelling? No. Pain Score  0 *  Have you tolerated food without any problems? Yes.    Have you been able to return to your normal activities? Yes.    Do you have any questions about your discharge instructions: Diet   No. Medications  No. Follow up visit  No.  Do you have questions or concerns about your Care? No.  Actions: * If pain score is 4 or above: No action needed, pain <4.

## 2016-06-29 ENCOUNTER — Encounter: Payer: Self-pay | Admitting: Gastroenterology

## 2016-07-07 ENCOUNTER — Telehealth: Payer: Self-pay | Admitting: Gastroenterology

## 2016-07-07 NOTE — Telephone Encounter (Signed)
Patient returned phone call. [facility] # [phone]

## 2016-07-07 NOTE — Telephone Encounter (Signed)
Left message for patient to call back  

## 2016-07-08 NOTE — Telephone Encounter (Signed)
All questions about path answered.  He will call back for any additional questions or concerns.

## 2016-07-08 NOTE — Telephone Encounter (Signed)
Left message for patient to call back  

## 2016-08-26 ENCOUNTER — Telehealth: Payer: Self-pay | Admitting: Family Medicine

## 2016-08-26 NOTE — Telephone Encounter (Signed)
Patient called to check on this message, please advise.

## 2016-08-26 NOTE — Telephone Encounter (Signed)
Patient pulled his back out about 2 days ago and says that usually we send in something for him.  He would like to know if we would be able to do this.  [place]

## 2016-08-26 NOTE — Telephone Encounter (Signed)
Spoke with patient to discuss symptoms. Patient stated that he is having lower back pain that he feels is muscular related. States he was seen for the same problem a year ago. He has been recently lifting and moving furniture in the house and pain started. Would like to know if what he was prescribed the last time can be called in again. Please advise?

## 2016-08-27 MED ORDER — NAPROXEN 500 MG PO TABS
ORAL_TABLET | ORAL | 0 refills | Status: DC
Start: 2016-08-27 — End: 2016-12-10

## 2016-08-27 NOTE — Telephone Encounter (Signed)
Patient called back and is hoping Dr. Richardson Landry or Hoyle Sauer can answer this today.

## 2016-08-27 NOTE — Telephone Encounter (Signed)
[facility] last yr rx'ed naprosyn 500 one bid with food, numb 30

## 2016-08-27 NOTE — Telephone Encounter (Signed)
Spoke with patient and informed him per Dr.Steve Luking- we are sending in Naprosyn 500 mg take 1 tablet twice a day with food. To [employer]. Patient verbalized understanding.

## 2016-11-20 ENCOUNTER — Ambulatory Visit (INDEPENDENT_AMBULATORY_CARE_PROVIDER_SITE_OTHER): Payer: Managed Care, Other (non HMO) | Admitting: Family Medicine

## 2016-11-20 ENCOUNTER — Encounter: Payer: Self-pay | Admitting: Family Medicine

## 2016-11-20 VITALS — BP 142/90 | Temp 98.2°F | Ht 72.0 in | Wt 194.0 lb

## 2016-11-20 DIAGNOSIS — R350 Frequency of micturition: Secondary | ICD-10-CM

## 2016-11-20 DIAGNOSIS — N410 Acute prostatitis: Secondary | ICD-10-CM

## 2016-11-20 LAB — POCT URINALYSIS DIPSTICK: PH UA: 7

## 2016-11-20 MED ORDER — CIPROFLOXACIN HCL 500 MG PO TABS: 500.0000 mg | ORAL_TABLET | Freq: Two times a day (BID) | ORAL | 0 refills | Status: DC

## 2016-11-20 NOTE — Patient Instructions (Signed)
Prostatitis Prostatitis is swelling of the prostate gland. The prostate helps to make semen. It is below a man's bladder, in front of the rectum. There are different types of prostatitis. Follow these instructions at home:  Take over-the-counter and prescription medicines only as told by your doctor.  If you were prescribed an antibiotic medicine, take it as told by your doctor. Do not stop taking the antibiotic even if you start to feel better.  If your doctor prescribed exercises, do them as directed.  Take sitz baths as told by your doctor. To take a sitz bath, sit in warm water that is deep enough to cover your hips and butt.  Keep all follow-up visits as told by your doctor. This is important. Contact a doctor if:  Your symptoms get worse.  You have a fever. Get help right away if:  You have chills.  You feel sick to your stomach (nauseous).  You throw up (vomit).  You feel light-headed.  You feel like you might pass out (faint).  You cannot pee (urinate).  You have blood or clumps of blood (blood clots) in your pee (urine). This information is not intended to replace advice given to you by your health care provider. Make sure you discuss any questions you have with your health care provider. Document Released: 05/24/2012 Document Revised: 08/13/2016 Document Reviewed: 08/13/2016 Elsevier Interactive Patient Education  2017 [employer] Inc[employer]  

## 2016-11-20 NOTE — Progress Notes (Signed)
   Subjective:    Patient ID: Francisco Hester, male    DOB: 1961-04-08, 55 y.o.   MRN: [id number]  Urinary Frequency   This is a new problem. The current episode started in the past 7 days. Associated symptoms include frequency. He has tried nothing for the symptoms.   Results for orders placed or performed in visit on 11/20/16  POCT urinalysis dipstick  Result Value Ref Range   Color, UA     Clarity, UA     Glucose, UA     Bilirubin, UA     Ketones, UA     Spec Grav, UA <=1.005    Blood, UA     pH, UA 7.0    Protein, UA     Urobilinogen, UA     Nitrite, UA     Leukocytes, UA  Negative    vinegar and honey suplement   Pt also has pomengaranat e juice  Itchy burning and irritation  Sensitive  No nocturia  None present    Review of Systems  Genitourinary: Positive for frequency.  No headache, no major weight loss or weight gain, no chest pain no back pain abdominal pain no change in bowel habits complete ROS otherwise negative      Objective:   Physical Exam Alert vitals stable, NAD. Blood pressure good on repeat. HEENT normal. Lungs clear. Heart regular rate and rhythm. Prostate gland boggy and tender  Urinalysis 2-4 white blood cells high-power field       Assessment & Plan:  Impression acute prostatitis. Numerous questions answered and patient educated to the nature of this. 0 correlation with prostate cancer. May not occur again for quite some time. With patients acute presentation and substantial interest in this diagnosis, recommend follow-up Recheck in several weeks with urinalysis. I advised patient urinalysis is normal and he's feeling back to normal we likely will not need to do a prostate infection.

## 2016-11-22 ENCOUNTER — Encounter: Payer: Self-pay | Admitting: Family Medicine

## 2016-12-10 ENCOUNTER — Encounter: Payer: Self-pay | Admitting: Family Medicine

## 2016-12-10 ENCOUNTER — Ambulatory Visit (INDEPENDENT_AMBULATORY_CARE_PROVIDER_SITE_OTHER): Payer: Managed Care, Other (non HMO) | Admitting: Family Medicine

## 2016-12-10 VITALS — BP 124/86 | Ht 72.0 in | Wt 198.0 lb

## 2016-12-10 DIAGNOSIS — M545 Low back pain: Secondary | ICD-10-CM

## 2016-12-10 DIAGNOSIS — G8929 Other chronic pain: Secondary | ICD-10-CM

## 2016-12-10 MED ORDER — NAPROXEN 500 MG PO TABS: ORAL_TABLET | ORAL | 2 refills | Status: DC

## 2016-12-10 NOTE — Progress Notes (Signed)
   Subjective:    Patient ID: Francisco Hester, male    DOB: 30-Jun-1961, 56 y.o.   MRN: [id number]  HPIFollow up prostate infection. Symptoms have cleared up. Has one more day to take on the cipro.  Requesting refill on naproxen to have for back pain.    patient reltes intermittent back pain sometimes radiates across lower back does not riate down the leg denies any other partiular troubles    Review of Systems  Constitutional: Negative for chills.  Gastrointestinal: Negative for abdominal pain and blood in stool.  Genitourinary: Negative for dysuria, frequency and genital sores.  Musculoskeletal: Positive for back pain.       Objective:   Physical Exam  Constitutional: He appears well-nourished. No distress.  Cardiovascular: Normal rate, regular rhythm and normal heart sounds.   No murmur heard. Pulmonary/Chest: Effort normal and breath sounds normal. No respiratory distress.  Musculoskeletal: He exhibits no edema.  Lymphadenopathy:    He has no cervical adenopathy.  Neurological: He is alert.  Psychiatric: His behavior is normal.  Vitals reviewed.         Assessment & Plan:  Prostatitis resolved no further antibiotics needed Importance of getting preventative health physical in June discusse Naproxen for intermittent for low back pain

## 2016-12-16 ENCOUNTER — Telehealth: Payer: Self-pay | Admitting: Family Medicine

## 2016-12-16 MED ORDER — CIPROFLOXACIN HCL 500 MG PO TABS: 500.0000 mg | ORAL_TABLET | Freq: Two times a day (BID) | ORAL | 0 refills | Status: AC

## 2016-12-16 NOTE — Telephone Encounter (Signed)
LMTCB.Marland Kitcheninhaled corticosteroids pt having other Sx?

## 2016-12-16 NOTE — Telephone Encounter (Signed)
I spoke with patient and advised new rx for cipro sent and to f/u if symptoms persist. He voiced understanding and agreed with plan.

## 2016-12-16 NOTE — Telephone Encounter (Signed)
Patient states he is still having pain with urination. He states he finished all  Cipro.  He states the "itch" was not present at the most recent appt w/ doc. He states as soon as he finished the ATB the itch started back.  He denies fever, abdominal pain, and penile discharge. Please advise.

## 2016-12-16 NOTE — Telephone Encounter (Signed)
Do one more round of cipro same dose, if symtoms persist o v with dr Nicki Reaper and if that happens be prepared for urinalysis

## 2016-12-16 NOTE — Telephone Encounter (Signed)
Patient states still having frequent urination problem was seen on 12/15 and prescribed cipro 500 it cleared up but has flared up again he wants antibiotic called into [facility]

## 2017-01-13 ENCOUNTER — Encounter: Payer: Self-pay | Admitting: Family Medicine

## 2017-01-13 ENCOUNTER — Telehealth: Payer: Self-pay | Admitting: Family Medicine

## 2017-01-13 ENCOUNTER — Ambulatory Visit (INDEPENDENT_AMBULATORY_CARE_PROVIDER_SITE_OTHER): Payer: Managed Care, Other (non HMO) | Admitting: Family Medicine

## 2017-01-13 VITALS — Temp 98.4°F | Ht 72.0 in | Wt 196.1 lb

## 2017-01-13 DIAGNOSIS — N410 Acute prostatitis: Secondary | ICD-10-CM

## 2017-01-13 MED ORDER — DOXYCYCLINE HYCLATE 100 MG PO CAPS: 100.0000 mg | ORAL_CAPSULE | Freq: Two times a day (BID) | ORAL | 0 refills | Status: DC

## 2017-01-13 NOTE — Telephone Encounter (Signed)
error 

## 2017-01-13 NOTE — Progress Notes (Signed)
This patient had acute prostatitis earlier in December with urinary frequency dysuria not feeling well was treated with ciprofloxacin he stated that he never quite got 100% well so he was treated with one additional round of ciprofloxacin patient denies fever hematuria denies rectal bleeding denies abdominal pain no nausea or vomiting. No sweats or chills he relates a burning sensation within urethra but night denies any urethral discharge and states at times urgency feeling but does not feel like he has to P a lot does not have to get up at night P PMH benign on exam lungs are clear no crackles heart is regular no murmurs abdomen is soft there is no guarding or rebound GU exam is normal testicle to killer exam is normal urethral appears normal no discharge noted rectal exam mildly enlarged prostate with subjective slight discomfort but not severe Urinalysis normal pH specific gravity negative for any blood or nitrates urine culture sent Probable persistent prostatitis with urethral discomfort-doxycycline twice a day for the next 3 weeks, I believe this will give a different level of coverage that Cipro did not. If he is having ongoing symptoms at that time when he finishes this the next step would be referral to urology for further evaluation recent PSA earlier in the year was considered good

## 2017-01-18 LAB — URINE CULTURE

## 2017-01-22 MED ORDER — NITROFURANTOIN MONOHYD MACRO 100 MG PO CAPS: 100.0000 mg | ORAL_CAPSULE | Freq: Two times a day (BID) | ORAL | 0 refills | Status: DC

## 2017-01-22 NOTE — Addendum Note (Signed)
Addended by: Dairl Ponder on: 01/22/2017 11:20 AM   Modules accepted: Orders

## 2017-01-28 ENCOUNTER — Telehealth: Payer: Self-pay | Admitting: Family Medicine

## 2017-01-28 NOTE — Telephone Encounter (Signed)
Seen 2/7 for acute prostatitis. Prescribed doxy #42 and added macrobid on 2/16 after culture came back. Pt will be finished with macrobid on Monday and doxy on tues. Pt states he is much better but still having slight itching on penis and a little burning with urination. Wants to know if he needs to have urine rechecked.

## 2017-01-28 NOTE — Telephone Encounter (Signed)
Left message return call 01/28/17

## 2017-01-28 NOTE — Telephone Encounter (Signed)
Discussed with pt. Pt verbalized understanding. He will come the end of next week and drop off urine to send for a urine culture

## 2017-01-28 NOTE — Telephone Encounter (Signed)
Patient finished antibiotic was seen on 2/7 and  wanting to know if he needed to be seen and  take another urine test.

## 2017-01-28 NOTE — Telephone Encounter (Signed)
I would recommend waiting until he is finished with the Macrobid and the doxycycline then recheck a urine later in the week it would be perfectly fine to set him up to drop by in cement urine for repeat urine culture

## 2017-02-05 ENCOUNTER — Other Ambulatory Visit: Payer: Self-pay | Admitting: Family Medicine

## 2017-02-05 ENCOUNTER — Other Ambulatory Visit: Payer: Self-pay

## 2017-02-05 DIAGNOSIS — Z87438 Personal history of other diseases of male genital organs: Secondary | ICD-10-CM

## 2017-02-08 LAB — URINE CULTURE: Organism ID, Bacteria: NO GROWTH

## 2017-02-12 ENCOUNTER — Other Ambulatory Visit: Payer: Self-pay | Admitting: Family Medicine

## 2017-02-12 ENCOUNTER — Telehealth: Payer: Self-pay | Admitting: Family Medicine

## 2017-02-12 DIAGNOSIS — N419 Inflammatory disease of prostate, unspecified: Secondary | ICD-10-CM

## 2017-02-12 MED ORDER — AZITHROMYCIN 250 MG PO TABS: ORAL_TABLET | ORAL | 0 refills | Status: DC

## 2017-02-12 NOTE — Telephone Encounter (Signed)
Pt called stating that he has finished taking the antibiotic and is still having burning and itching. Pt is wanting to know what he should do next. Please advise.

## 2017-02-12 NOTE — Telephone Encounter (Signed)
Spoke with patient and informed him per Dr.Scott Luking-Dr.Scott  would not recommend further antibiotics at this point in time. At this point he thinks it would be reasonable for the patient to be seen by urology for further evaluation. Dr.Scott  will give you a call later this evening when he finishes  office hours. Please go ahead and put in the referral. Patient verbalized understanding.

## 2017-02-12 NOTE — Telephone Encounter (Signed)
Left message return call 02/12/2017

## 2017-02-12 NOTE — Telephone Encounter (Signed)
Please let the patient know that I would not recommend further antibiotics at this point in time. At this point I think it would be reasonable for the patient to be seen by urology for further evaluation. Let the patient know that I will give him a call later this evening when I finish office hours. Please go ahead and put in the referral for urology Francisco Hester as urgent

## 2017-02-15 NOTE — Telephone Encounter (Signed)
I did discuss this with the patient. The medication azithromycin was sent in. If he has ongoing troubles next step will be urology-referral was put in

## 2017-05-04 ENCOUNTER — Telehealth: Payer: Self-pay | Admitting: Family Medicine

## 2017-05-04 DIAGNOSIS — Z125 Encounter for screening for malignant neoplasm of prostate: Secondary | ICD-10-CM

## 2017-05-04 DIAGNOSIS — Z0000 Encounter for general adult medical examination without abnormal findings: Secondary | ICD-10-CM

## 2017-05-04 DIAGNOSIS — Z13220 Encounter for screening for lipoid disorders: Secondary | ICD-10-CM

## 2017-05-04 NOTE — Telephone Encounter (Signed)
bloodwork orders put in and pt notified on voicemail.

## 2017-05-04 NOTE — Telephone Encounter (Signed)
PSA, lipid, liver, metabolic 7 

## 2017-05-04 NOTE — Telephone Encounter (Signed)
Patient is requesting order for blood work for appointment on 05/18/17 with Dr. Nicki Reaper.

## 2017-05-12 ENCOUNTER — Encounter: Payer: Managed Care, Other (non HMO) | Admitting: Family Medicine

## 2017-05-13 LAB — LIPID PANEL
CHOL/HDL RATIO: 4.3 ratio (ref 0.0–5.0)
Cholesterol, Total: 187 mg/dL (ref 100–199)
HDL: 43 mg/dL (ref >39)
LDL Calculated: 117 mg/dL — ABNORMAL HIGH (ref 0–99)
Triglycerides: 135 mg/dL (ref 0–149)
VLDL Cholesterol Cal: 27 mg/dL (ref 5–40)

## 2017-05-13 LAB — BASIC METABOLIC PANEL
BUN / CREAT RATIO: 11 (ref 9–20)
BUN: 14 mg/dL (ref 6–24)
CO2: 26 mmol/L (ref 18–29)
Calcium: 9.8 mg/dL (ref 8.7–10.2)
Chloride: 100 mmol/L (ref 96–106)
Creatinine, Ser: 1.28 mg/dL — ABNORMAL HIGH (ref 0.76–1.27)
GFR calc Af Amer: 71 mL/min/{1.73_m2} (ref >59)
GFR calc non Af Amer: 62 mL/min/{1.73_m2} (ref >59)
GLUCOSE: 94 mg/dL (ref 65–99)
Potassium: 4.6 mmol/L (ref 3.5–5.2)
Sodium: 140 mmol/L (ref 134–144)

## 2017-05-13 LAB — HEPATIC FUNCTION PANEL
ALBUMIN: 4.7 g/dL (ref 3.5–5.5)
ALT: 21 IU/L (ref 0–44)
AST: 24 IU/L (ref 0–40)
Alkaline Phosphatase: 70 IU/L (ref 39–117)
BILIRUBIN, DIRECT: 0.14 mg/dL (ref 0.00–0.40)
Bilirubin Total: 0.6 mg/dL (ref 0.0–1.2)
TOTAL PROTEIN: 6.4 g/dL (ref 6.0–8.5)

## 2017-05-13 LAB — PSA: Prostate Specific Ag, Serum: 3.1 ng/mL (ref 0.0–4.0)

## 2017-05-18 ENCOUNTER — Encounter: Payer: Self-pay | Admitting: Family Medicine

## 2017-05-18 ENCOUNTER — Ambulatory Visit (INDEPENDENT_AMBULATORY_CARE_PROVIDER_SITE_OTHER): Payer: 59 | Admitting: Family Medicine

## 2017-05-18 VITALS — BP 136/86 | Ht 72.5 in | Wt 191.4 lb

## 2017-05-18 DIAGNOSIS — R7989 Other specified abnormal findings of blood chemistry: Secondary | ICD-10-CM | POA: Diagnosis not present

## 2017-05-18 DIAGNOSIS — Z0000 Encounter for general adult medical examination without abnormal findings: Secondary | ICD-10-CM

## 2017-05-18 NOTE — Progress Notes (Signed)
   Subjective:    Patient ID: Francisco Hester, male    DOB: 06-16-1961, 56 y.o.   MRN: [id number]  HPI The patient comes in today for a wellness visit. 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 They did a prostate exam 2 months ago   A review of their health history was completed.  A review of medications was also completed.  Any needed refills; None  Eating habits: Eating habits are very good although recently has been eating hamburger little more often  Falls/  MVA accidents in past few months: None   Regular exercise: yes, Runs 3 days a week. Left weight 3 days per week.  Specialist pt sees on regular basis: Urologist   Preventative health issues were discussed.   Additional concerns:  Patient has concerns of old right hand injury.    Review of Systems  Constitutional: Negative for activity change, appetite change and fever.  HENT: Negative for congestion and rhinorrhea.   Eyes: Negative for discharge.  Respiratory: Negative for cough and wheezing.   Cardiovascular: Negative for chest pain.  Gastrointestinal: Negative for abdominal pain, blood in stool and vomiting.  Genitourinary: Negative for difficulty urinating and frequency.  Musculoskeletal: Negative for neck pain.  Skin: Negative for rash.  Allergic/Immunologic: Negative for environmental allergies and food allergies.  Neurological: Negative for weakness and headaches.  Psychiatric/Behavioral: Negative for agitation.       Objective:   Physical Exam  Constitutional: He appears well-developed and well-nourished.  HENT:  Head: Normocephalic and atraumatic.  Right Ear: External ear normal.  Left Ear: External ear normal.  Nose: Nose normal.  Mouth/Throat: Oropharynx is clear and moist.  Eyes: EOM are normal. Pupils are equal,  round, and reactive to light.  Neck: Normal range of motion. Neck supple. No thyromegaly present.  Cardiovascular: Normal rate, regular rhythm and normal heart sounds.   No murmur heard. Pulmonary/Chest: Effort normal and breath sounds normal. No respiratory distress. He has no wheezes.  Abdominal: Soft. Bowel sounds are normal. He exhibits no distension and no mass. There is no tenderness.  Genitourinary: Penis normal.  Musculoskeletal: Normal range of motion. He exhibits no edema.  Lymphadenopathy:    He has no cervical adenopathy.  Neurological: He is alert. He exhibits normal muscle tone.  Skin: Skin is warm and dry. No erythema.  Psychiatric: He has a normal mood and affect. His behavior is normal. Judgment normal.          Assessment & Plan:  Adult wellness-complete.wellness physical was conducted today. Importance of diet and exercise were discussed in detail. In addition to this a discussion regarding safety was also covered. We also reviewed over immunizations and gave recommendations regarding current immunization needed for age. In addition to this additional areas were also touched on including: Preventative health exams needed: Colonoscopy Up-to-date  Patient was advised yearly wellness exam Hyperlipidemia LDL slightly elevated walk work on diet recheck it again extends months time  Patient due for screening regarding hep C HIV do this in 6 months  PSA slightly higher than what I would expect although this could be related to prostatitis being followed by urology but I do recommend repeat PSA in 6 months  Elevated creatinine borderline monitor avoid excessive protein  Wellness in one year

## 2017-12-16 ENCOUNTER — Other Ambulatory Visit: Payer: Self-pay | Admitting: *Deleted

## 2017-12-16 DIAGNOSIS — Z79899 Other long term (current) drug therapy: Secondary | ICD-10-CM

## 2017-12-16 DIAGNOSIS — Z114 Encounter for screening for human immunodeficiency virus [HIV]: Secondary | ICD-10-CM

## 2017-12-16 DIAGNOSIS — E785 Hyperlipidemia, unspecified: Secondary | ICD-10-CM

## 2017-12-16 DIAGNOSIS — Z1159 Encounter for screening for other viral diseases: Secondary | ICD-10-CM

## 2017-12-16 DIAGNOSIS — Z125 Encounter for screening for malignant neoplasm of prostate: Secondary | ICD-10-CM

## 2017-12-22 ENCOUNTER — Telehealth: Payer: Self-pay | Admitting: Family Medicine

## 2017-12-22 DIAGNOSIS — D696 Thrombocytopenia, unspecified: Secondary | ICD-10-CM

## 2017-12-22 NOTE — Telephone Encounter (Signed)
Patient would like a nurse to call him back as he wants to know why he is being tested for HIV in his lab orders.

## 2017-12-22 NOTE — Telephone Encounter (Signed)
Please add CBC and liver profile-diagnosis thrombocytopenia-plus the patient should do all the other tests which were ordered very important to make sure all of these get drawn

## 2017-12-22 NOTE — Addendum Note (Signed)
Addended by: Karle Barr on: 12/22/2017 01:49 PM   Modules accepted: Orders

## 2017-12-22 NOTE — Telephone Encounter (Signed)
Pt called stating his wife wanted to know why we were drawing HIV on him. I told him that it is recommended that pt's have at least one per lifetime and if he did not want that particular test done he could just tell the lab he did not want it drawn.

## 2017-12-22 NOTE — Telephone Encounter (Signed)
Patient has labs that we ordered 12/16/17- add any?

## 2017-12-22 NOTE — Telephone Encounter (Signed)
Patient is aware 

## 2017-12-22 NOTE — Telephone Encounter (Signed)
Patient requesting labs to be done the first week of April when his wellness is due.

## 2017-12-22 NOTE — Telephone Encounter (Signed)
I called and left message to r/c. I have added the additional labs.Need to make sure all are drawn.

## 2018-04-14 ENCOUNTER — Telehealth: Payer: Self-pay | Admitting: Family Medicine

## 2018-04-14 NOTE — Telephone Encounter (Signed)
Patient has an appointment on 05/19/18 with Dr. Nicki Reaper.  He is requesting orders for labs.

## 2018-04-15 NOTE — Telephone Encounter (Signed)
Spoke to wife and wife will let pt know to come by and pick this up

## 2018-04-15 NOTE — Telephone Encounter (Signed)
Orders for his lab work were put into the system in January they should still be good but there are 2 separate orders one dated January 16 1 dated January 10.  I wrote these down on letter head.  The patient may come by pickup letterhead and do fasting lab work at [facility]. it is important for the patient to do this lab work no later than the first week of June-if any problems or confusion let me know-thanks

## 2018-04-25 ENCOUNTER — Telehealth: Payer: Self-pay | Admitting: *Deleted

## 2018-04-25 DIAGNOSIS — R5383 Other fatigue: Secondary | ICD-10-CM

## 2018-04-25 DIAGNOSIS — Z1159 Encounter for screening for other viral diseases: Secondary | ICD-10-CM

## 2018-04-25 DIAGNOSIS — E785 Hyperlipidemia, unspecified: Secondary | ICD-10-CM

## 2018-04-25 DIAGNOSIS — Z79899 Other long term (current) drug therapy: Secondary | ICD-10-CM

## 2018-04-25 DIAGNOSIS — N400 Enlarged prostate without lower urinary tract symptoms: Secondary | ICD-10-CM

## 2018-04-25 NOTE — Telephone Encounter (Signed)
Pt is in reminder file to do bloowork in December. Orders were put in for pt to do in January. Pt has not done bloodwork yet.

## 2018-04-26 NOTE — Telephone Encounter (Signed)
I called and left a message with his wife asked that she have the pt to r/c.

## 2018-04-26 NOTE — Telephone Encounter (Signed)
As for the lab orders the following is the test that I would want him to complete.  I reviewed over the other messages that go all the way back to December.  Therefore check hepatitis C antibody, PSA, metabolic 7, lipid, CBC, liver.  It is fine to go ahead and put these into the system.  Patient does not want HIV test according to what was on a message back in January.  If you want to reenter these lab tests in order to clear up any confusion that would be fine and notify the patient accordingly thank you

## 2018-04-26 NOTE — Telephone Encounter (Signed)
Patient is aware 

## 2018-04-30 LAB — BASIC METABOLIC PANEL
BUN/Creatinine Ratio: 12 (ref 9–20)
BUN: 13 mg/dL (ref 6–24)
CO2: 28 mmol/L (ref 20–29)
CREATININE: 1.1 mg/dL (ref 0.76–1.27)
Calcium: 9.7 mg/dL (ref 8.7–10.2)
Chloride: 101 mmol/L (ref 96–106)
GFR calc Af Amer: 85 mL/min/{1.73_m2} (ref >59)
GFR, EST NON AFRICAN AMERICAN: 74 mL/min/{1.73_m2} (ref >59)
Glucose: 98 mg/dL (ref 65–99)
Potassium: 4.4 mmol/L (ref 3.5–5.2)
SODIUM: 142 mmol/L (ref 134–144)

## 2018-04-30 LAB — HIV ANTIBODY (ROUTINE TESTING W REFLEX): HIV SCREEN 4TH GENERATION: NONREACTIVE

## 2018-04-30 LAB — HEPATITIS C ANTIBODY: Hep C Virus Ab: 0.1 s/co ratio (ref 0.0–0.9)

## 2018-04-30 LAB — LIPID PANEL
Chol/HDL Ratio: 4.3 ratio (ref 0.0–5.0)
Cholesterol, Total: 185 mg/dL (ref 100–199)
HDL: 43 mg/dL (ref >39)
LDL Calculated: 109 mg/dL — ABNORMAL HIGH (ref 0–99)
TRIGLYCERIDES: 167 mg/dL — AB (ref 0–149)
VLDL CHOLESTEROL CAL: 33 mg/dL (ref 5–40)

## 2018-04-30 LAB — PSA: Prostate Specific Ag, Serum: 2.2 ng/mL (ref 0.0–4.0)

## 2018-05-19 ENCOUNTER — Ambulatory Visit (INDEPENDENT_AMBULATORY_CARE_PROVIDER_SITE_OTHER): Payer: 59 | Admitting: Family Medicine

## 2018-05-19 ENCOUNTER — Encounter: Payer: Self-pay | Admitting: Family Medicine

## 2018-05-19 VITALS — BP 124/86 | Ht 72.5 in | Wt 195.4 lb

## 2018-05-19 DIAGNOSIS — Z0000 Encounter for general adult medical examination without abnormal findings: Secondary | ICD-10-CM | POA: Diagnosis not present

## 2018-05-19 NOTE — Progress Notes (Signed)
   Subjective:    Patient ID: Francisco Hester, male    DOB: 04/14/1961, 57 y.o.   MRN: [id number]  HPI The patient comes in today for a wellness visit. Safety dietary discussed Patient does not drink or smoke Patient not depressed Has had some prostatitis issues but none recently Urinating well No rectal bleeding Up-to-date on vaccines as well as colonoscopy   A review of their health history was completed.  A review of medications was also completed.  Any needed refills; no  Eating habits: eats lots of [employer], tries to do good. Diet not as good in the past year due to deaths in family.  Falls/  MVA accidents in past few months: no  Regular exercise: run lift weights 3-4 days a week  Specialist pt sees on regular basis: sees urologist for prostate infection; has not had to go back  Preventative health issues were discussed.   Additional concerns: none   Review of Systems  Constitutional: Negative for activity change, appetite change and fever.  HENT: Negative for congestion and rhinorrhea.   Eyes: Negative for discharge.  Respiratory: Negative for cough and wheezing.   Cardiovascular: Negative for chest pain.  Gastrointestinal: Negative for abdominal pain, blood in stool and vomiting.  Genitourinary: Negative for difficulty urinating and frequency.  Musculoskeletal: Negative for neck pain.  Skin: Negative for rash.  Allergic/Immunologic: Negative for environmental allergies and food allergies.  Neurological: Negative for weakness and headaches.  Psychiatric/Behavioral: Negative for agitation.       Objective:   Physical Exam  Constitutional: He appears well-developed and well-nourished.  HENT:  Head: Normocephalic and atraumatic.  Right Ear: External ear normal.  Left Ear: External ear normal.  Nose: Nose normal.  Mouth/Throat: Oropharynx is clear and moist.  Eyes: Pupils are equal, round, and reactive to light. EOM are normal.  Neck: Normal range of  motion. Neck supple. No thyromegaly present.  Cardiovascular: Normal rate, regular rhythm and normal heart sounds.  No murmur heard. Pulmonary/Chest: Effort normal and breath sounds normal. No respiratory distress. He has no wheezes.  Abdominal: Soft. Bowel sounds are normal. He exhibits no distension and no mass. There is no tenderness.  Genitourinary: Prostate normal and penis normal.  Musculoskeletal: Normal range of motion. He exhibits no edema.  Lymphadenopathy:    He has no cervical adenopathy.  Neurological: He is alert. He exhibits normal muscle tone.  Skin: Skin is warm and dry. No erythema.  Psychiatric: He has a normal mood and affect. His behavior is normal. Judgment normal.          Assessment & Plan:  I reviewed over the patient's lab work Mild elevation of cholesterol not bad Low risk Does not need to be on medication Adult wellness-complete.wellness physical was conducted today. Importance of diet and exercise were discussed in detail.  In addition to this a discussion regarding safety was also covered. We also reviewed over immunizations and gave recommendations regarding current immunization needed for age.  In addition to this additional areas were also touched on including: Preventative health exams needed:  Colonoscopy up-to-date-next one due 2022  Patient was advised yearly wellness exam  The patient had a form from his wife's workplace for wellness that also included getting MMR titers as well as A1c this is not typical or standard at all but for this type of physical he will check into it if it is required by her workplace we will need to order it

## 2018-06-08 ENCOUNTER — Telehealth: Payer: Self-pay | Admitting: Family Medicine

## 2018-06-08 NOTE — Telephone Encounter (Signed)
Contacted patient and asked about the A1C and MMR bloodwork. Pt is going to call back to let nurse know if he needs this done or not.

## 2018-06-08 NOTE — Telephone Encounter (Signed)
Pt called back to state these labs were not needed. Faxed form to [facility] and pt notified.

## 2018-10-26 ENCOUNTER — Encounter: Payer: Self-pay | Admitting: Family Medicine

## 2018-10-26 ENCOUNTER — Ambulatory Visit (INDEPENDENT_AMBULATORY_CARE_PROVIDER_SITE_OTHER): Payer: 59 | Admitting: Family Medicine

## 2018-10-26 VITALS — BP 154/98 | Ht 72.5 in | Wt 200.4 lb

## 2018-10-26 DIAGNOSIS — S39012A Strain of muscle, fascia and tendon of lower back, initial encounter: Secondary | ICD-10-CM

## 2018-10-26 DIAGNOSIS — M545 Low back pain: Secondary | ICD-10-CM | POA: Diagnosis not present

## 2018-10-26 MED ORDER — TIZANIDINE HCL 4 MG PO CAPS: 4.0000 mg | ORAL_CAPSULE | Freq: Three times a day (TID) | ORAL | 0 refills | Status: DC | PRN

## 2018-10-26 MED ORDER — ETODOLAC 400 MG PO TABS: ORAL_TABLET | ORAL | 0 refills | Status: DC

## 2018-10-26 NOTE — Progress Notes (Signed)
   Subjective:    Patient ID: Francisco Hester, male    DOB: 14-Aug-1961, 57 y.o.   MRN: [id number]  Back Pain  This is a chronic problem. The current episode started today (after tighting up some screws on a table). The pain is present in the lumbar spine. Treatments tried: naproxen. The treatment provided no relief.    Pt has been having back pain, off and on for the past seven years  Has been two yrs since last flare  Pt stays physically active,  Lift wights This morning was angled a fit tightening screws,   Went into the bedroom, and felt a twinge when walking  Dragged a deer a ways, had help     Pain loc ed up , its fairly excrutiating, feels tight and locked up  Pain is severe at times       [facility]    Review of Systems  Musculoskeletal: Positive for back pain.       Objective:   Physical Exam  Alert active good hydration.  Lungs clear heart regular rate and rhythm HEENT normal.  No CVA tenderness.  No spinal tenderness.  Some discomfort deep palpation paraspinal muscles and ligaments.  Negative straight leg raise      Assessment & Plan:  Impression lumbar strain by history and exam.  Explained to patient.  He wants to know why he keeps getting these.  It has been 2 years since the last flare.  I advised 97% likelihood that this is not a disc or pinched nerve rather strain of muscle and ligaments.  His exam supports this.  In addition the history of bending awkwardly while screwing in a screw and having the sudden pain develop, a few days after having to lift a heavy deer, is classic for lumbar strain.  Local measures discussed.  Anti-inflammatory and muscle spasm medicine prescribed.  Follow-up with Dr. Nicki Reaper in 2 weeks due to patient's concerns  Greater than 50% of this 15 minute face to face visit was spent in counseling and discussion and coordination of care regarding the above diagnosis/diagnosies

## 2018-11-09 ENCOUNTER — Ambulatory Visit: Payer: 59 | Admitting: Family Medicine

## 2019-04-14 ENCOUNTER — Telehealth: Payer: Self-pay | Admitting: Family Medicine

## 2019-04-14 DIAGNOSIS — Z125 Encounter for screening for malignant neoplasm of prostate: Secondary | ICD-10-CM

## 2019-04-14 DIAGNOSIS — E785 Hyperlipidemia, unspecified: Secondary | ICD-10-CM

## 2019-04-14 DIAGNOSIS — Z79899 Other long term (current) drug therapy: Secondary | ICD-10-CM

## 2019-04-14 NOTE — Telephone Encounter (Signed)
Lip/liv/met 7/psa 

## 2019-04-14 NOTE — Telephone Encounter (Signed)
Last labs 04/29/18 lipid, bmp, psa, hep c, hiv

## 2019-04-14 NOTE — Telephone Encounter (Signed)
Scheduled June 30th for a physical and would like lab orders put in for Lula.

## 2019-04-17 NOTE — Telephone Encounter (Signed)
Blood work ordered in Epic. Left message to return call to notify patient. 

## 2019-04-17 NOTE — Telephone Encounter (Signed)
Patient notified and verbalized understanding. 

## 2019-05-27 LAB — HEPATIC FUNCTION PANEL
ALT: 17 IU/L (ref 0–44)
AST: 21 IU/L (ref 0–40)
Albumin: 4.8 g/dL (ref 3.8–4.9)
Alkaline Phosphatase: 64 IU/L (ref 39–117)
Bilirubin Total: 0.7 mg/dL (ref 0.0–1.2)
Bilirubin, Direct: 0.13 mg/dL (ref 0.00–0.40)
Total Protein: 6.4 g/dL (ref 6.0–8.5)

## 2019-05-27 LAB — BASIC METABOLIC PANEL
BUN/Creatinine Ratio: 13 (ref 9–20)
BUN: 16 mg/dL (ref 6–24)
CO2: 27 mmol/L (ref 20–29)
Calcium: 10 mg/dL (ref 8.7–10.2)
Chloride: 101 mmol/L (ref 96–106)
Creatinine, Ser: 1.23 mg/dL (ref 0.76–1.27)
GFR calc Af Amer: 74 mL/min/{1.73_m2} (ref >59)
GFR calc non Af Amer: 64 mL/min/{1.73_m2} (ref >59)
Glucose: 97 mg/dL (ref 65–99)
Potassium: 4.3 mmol/L (ref 3.5–5.2)
Sodium: 141 mmol/L (ref 134–144)

## 2019-05-27 LAB — LIPID PANEL
Chol/HDL Ratio: 4.7 ratio (ref 0.0–5.0)
Cholesterol, Total: 185 mg/dL (ref 100–199)
HDL: 39 mg/dL — ABNORMAL LOW (ref >39)
LDL Calculated: 113 mg/dL — ABNORMAL HIGH (ref 0–99)
Triglycerides: 163 mg/dL — ABNORMAL HIGH (ref 0–149)
VLDL Cholesterol Cal: 33 mg/dL (ref 5–40)

## 2019-05-27 LAB — PSA: Prostate Specific Ag, Serum: 2.1 ng/mL (ref 0.0–4.0)

## 2019-06-05 ENCOUNTER — Other Ambulatory Visit: Payer: Self-pay

## 2019-06-06 ENCOUNTER — Encounter: Payer: Self-pay | Admitting: Family Medicine

## 2019-06-06 ENCOUNTER — Ambulatory Visit (INDEPENDENT_AMBULATORY_CARE_PROVIDER_SITE_OTHER): Payer: Managed Care, Other (non HMO) | Admitting: Family Medicine

## 2019-06-06 VITALS — BP 130/86 | Temp 97.4°F | Ht 72.5 in | Wt 196.2 lb

## 2019-06-06 DIAGNOSIS — E785 Hyperlipidemia, unspecified: Secondary | ICD-10-CM | POA: Diagnosis not present

## 2019-06-06 DIAGNOSIS — D696 Thrombocytopenia, unspecified: Secondary | ICD-10-CM

## 2019-06-06 NOTE — Patient Instructions (Signed)
Results for orders placed or performed in visit on 04/14/19  Lipid panel  Result Value Ref Range   Cholesterol, Total 185 100 - 199 mg/dL   Triglycerides 163 (H) 0 - 149 mg/dL   HDL 39 (L) >39 mg/dL   VLDL Cholesterol Cal 33 5 - 40 mg/dL   LDL Calculated 113 (H) 0 - 99 mg/dL   Chol/HDL Ratio 4.7 0.0 - 5.0 ratio  Hepatic function panel  Result Value Ref Range   Total Protein 6.4 6.0 - 8.5 g/dL   Albumin 4.8 3.8 - 4.9 g/dL   Bilirubin Total 0.7 0.0 - 1.2 mg/dL   Bilirubin, Direct 0.13 0.00 - 0.40 mg/dL   Alkaline Phosphatase 64 39 - 117 IU/L   AST 21 0 - 40 IU/L   ALT 17 0 - 44 IU/L  Basic metabolic panel  Result Value Ref Range   Glucose 97 65 - 99 mg/dL   BUN 16 6 - 24 mg/dL   Creatinine, Ser 1.23 0.76 - 1.27 mg/dL   GFR calc non Af Amer 64 >59 mL/min/1.73   GFR calc Af Amer 74 >59 mL/min/1.73   BUN/Creatinine Ratio 13 9 - 20   Sodium 141 134 - 144 mmol/L   Potassium 4.3 3.5 - 5.2 mmol/L   Chloride 101 96 - 106 mmol/L   CO2 27 20 - 29 mmol/L   Calcium 10.0 8.7 - 10.2 mg/dL  PSA  Result Value Ref Range   Prostate Specific Ag, Serum 2.1 0.0 - 4.0 ng/mL

## 2019-06-06 NOTE — Progress Notes (Signed)
Subjective:    Patient ID: Francisco Hester, male    DOB: 1961/06/07, 58 y.o.   MRN: [id number]  HPI The patient comes in today for a wellness visit.    A review of their health history was completed.  A review of medications was also completed.  Any needed refills; none  Eating habits: ok but not as good as could be-has had a lot going on in last year  Falls/  MVA accidents in past few months: none  Regular exercise: running 3 times a week and walking and lifting weights  Specialist pt sees on regular basis: no  Preventative health issues were discussed.   Additional concerns: discuss recent labs    Review of Systems  Constitutional: Negative for activity change, appetite change and fever.  HENT: Negative for congestion and rhinorrhea.   Eyes: Negative for discharge.  Respiratory: Negative for cough and wheezing.   Cardiovascular: Negative for chest pain.  Gastrointestinal: Negative for abdominal pain, blood in stool and vomiting.  Genitourinary: Negative for difficulty urinating and frequency.  Musculoskeletal: Negative for neck pain.  Skin: Negative for rash.  Allergic/Immunologic: Negative for environmental allergies and food allergies.  Neurological: Negative for weakness and headaches.  Psychiatric/Behavioral: Negative for agitation.       Objective:   Physical Exam Constitutional:      Appearance: He is well-developed.  HENT:     Head: Normocephalic and atraumatic.     Right Ear: External ear normal.     Left Ear: External ear normal.     Nose: Nose normal.  Eyes:     Pupils: Pupils are equal, round, and reactive to light.  Neck:     Musculoskeletal: Normal range of motion and neck supple.     Thyroid: No thyromegaly.  Cardiovascular:     Rate and Rhythm: Normal rate and regular rhythm.     Heart sounds: Normal heart sounds. No murmur.  Pulmonary:     Effort: Pulmonary effort is normal. No respiratory distress.     Breath sounds: Normal breath  sounds. No wheezing.  Abdominal:     General: Bowel sounds are normal. There is no distension.     Palpations: Abdomen is soft. There is no mass.     Tenderness: There is no abdominal tenderness.  Genitourinary:    Penis: Normal.   Musculoskeletal: Normal range of motion.  Lymphadenopathy:     Cervical: No cervical adenopathy.  Skin:    General: Skin is warm and dry.     Findings: No erythema.  Neurological:     Mental Status: He is alert.     Motor: No abnormal muscle tone.  Psychiatric:        Behavior: Behavior normal.        Judgment: Judgment normal.    Prostate slightly enlarged no hard nodules  Labs were reviewed with the patient     Assessment & Plan:  Adult wellness-complete.wellness physical was conducted today. Importance of diet and exercise were discussed in detail.  In addition to this a discussion regarding safety was also covered. We also reviewed over immunizations and gave recommendations regarding current immunization needed for age.  In addition to this additional areas were also touched on including: Preventative health exams needed:  Colonoscopy 2022  Patient was advised yearly wellness exam  Patient has history of thrombocytopenia and is been stable we will recheck CBC within the coming months  Hyperlipidemia increased risk heart disease patient states he can clean up his  diet does not want to be on medicines currently he will recheck cholesterol profile in 4 months if it is not under better control then the next step will be starting a statin but he does not want to do this currently  Mild BPH but no symptoms PSA is normal continue arises

## 2019-09-07 LAB — CBC WITH DIFFERENTIAL/PLATELET
Basophils Absolute: 0 10*3/uL (ref 0.0–0.2)
Basos: 1 %
EOS (ABSOLUTE): 0.1 10*3/uL (ref 0.0–0.4)
Eos: 1 %
Hematocrit: 46.1 % (ref 37.5–51.0)
Hemoglobin: 16.2 g/dL (ref 13.0–17.7)
Immature Grans (Abs): 0 10*3/uL (ref 0.0–0.1)
Immature Granulocytes: 0 %
Lymphocytes Absolute: 1.5 10*3/uL (ref 0.7–3.1)
Lymphs: 34 %
MCH: 31.2 pg (ref 26.6–33.0)
MCHC: 35.1 g/dL (ref 31.5–35.7)
MCV: 89 fL (ref 79–97)
Monocytes Absolute: 0.4 10*3/uL (ref 0.1–0.9)
Monocytes: 9 %
Neutrophils Absolute: 2.5 10*3/uL (ref 1.4–7.0)
Neutrophils: 55 %
Platelets: 159 10*3/uL (ref 150–450)
RBC: 5.19 x10E6/uL (ref 4.14–5.80)
RDW: 13 % (ref 11.6–15.4)
WBC: 4.5 10*3/uL (ref 3.4–10.8)

## 2019-09-07 LAB — LIPID PANEL
Chol/HDL Ratio: 4.1 ratio (ref 0.0–5.0)
Cholesterol, Total: 174 mg/dL (ref 100–199)
HDL: 42 mg/dL (ref >39)
LDL Chol Calc (NIH): 110 mg/dL — ABNORMAL HIGH (ref 0–99)
Triglycerides: 125 mg/dL (ref 0–149)
VLDL Cholesterol Cal: 22 mg/dL (ref 5–40)

## 2019-09-19 ENCOUNTER — Telehealth: Payer: Self-pay | Admitting: Family Medicine

## 2019-09-19 NOTE — Telephone Encounter (Signed)
I did have a conversation with the patient.  Given that he is 2 weeks out it is no longer necessary for him to consider testing he has no symptoms.  I did talk to him about the common nature of this issue and I also suggested he call customer service at [facility] to tell them they truly should be calling the patient rather than sending a letter 2 weeks later

## 2019-09-19 NOTE — Telephone Encounter (Signed)
Pt states he had labs drawn 09/06/2019 & just received a letter today that an employee at [facility] [employer] tested positive for Covid & he may or may not have had direct contact with that employee  Pt states he feels fine, no symptoms, not tested  Suggestion?   Please advise & call pt    (explained that Dr. Nicki Reaper is not in the office today, he mentioned he'd just call him at home since they are friends, explained that I needed to document this message properly in his chart)

## 2019-09-19 NOTE — Telephone Encounter (Signed)
Please advise. Thank you

## 2020-02-23 ENCOUNTER — Other Ambulatory Visit: Payer: Self-pay

## 2020-02-23 ENCOUNTER — Ambulatory Visit (INDEPENDENT_AMBULATORY_CARE_PROVIDER_SITE_OTHER): Payer: 59 | Admitting: Orthopedic Surgery

## 2020-02-23 ENCOUNTER — Ambulatory Visit: Payer: 59

## 2020-02-23 ENCOUNTER — Encounter: Payer: Self-pay | Admitting: Orthopedic Surgery

## 2020-02-23 VITALS — Temp 97.3°F | Ht 72.0 in | Wt 197.0 lb

## 2020-02-23 DIAGNOSIS — G8929 Other chronic pain: Secondary | ICD-10-CM

## 2020-02-23 DIAGNOSIS — M5442 Lumbago with sciatica, left side: Secondary | ICD-10-CM | POA: Diagnosis not present

## 2020-02-23 NOTE — Patient Instructions (Signed)
Sciatica  Sciatica is pain, numbness, weakness, or tingling along the path of the sciatic nerve. The sciatic nerve starts in the lower back and runs down the back of each leg. The nerve controls the muscles in the lower leg and in the back of the knee. It also provides feeling (sensation) to the back of the thigh, the lower leg, and the sole of the foot. Sciatica is a symptom of another medical condition that pinches or puts pressure on the sciatic nerve. Sciatica most often only affects one side of the body. Sciatica usually goes away on its own or with treatment. In some cases, sciatica may come back (recur). What are the causes? This condition is caused by pressure on the sciatic nerve or pinching of the nerve. This may be the result of:  A disk in between the bones of the spine bulging out too far (herniated disk).  Age-related changes in the spinal disks.  A pain disorder that affects a muscle in the buttock.  Extra bone growth near the sciatic nerve.  A break (fracture) of the pelvis.  Pregnancy.  Tumor. This is rare. What increases the risk? The following factors may make you more likely to develop this condition:  Playing sports that place pressure or stress on the spine.  Having poor strength and flexibility.  A history of back injury or surgery.  Sitting for long periods of time.  Doing activities that involve repetitive bending or lifting.  Obesity. What are the signs or symptoms? Symptoms can vary from mild to very severe, and they may include:  Any of these problems in the lower back, leg, hip, or buttock: ? Mild tingling, numbness, or dull aches. ? Burning sensations. ? Sharp pains.  Numbness in the back of the calf or the sole of the foot.  Leg weakness.  Severe back pain that makes movement difficult. Symptoms may get worse when you cough, sneeze, or laugh, or when you sit or stand for long periods of time. How is this diagnosed? This condition may be  diagnosed based on:  Your symptoms and medical history.  A physical exam.  Blood tests.  Imaging tests, such as: ? X-rays. ? MRI. ? CT scan. How is this treated? In many cases, this condition improves on its own without treatment. However, treatment may include:  Reducing or modifying physical activity.  Exercising and stretching.  Icing and applying heat to the affected area.  Medicines that help to: ? Relieve pain and swelling. ? Relax your muscles.  Injections of medicines that help to relieve pain, irritation, and inflammation around the sciatic nerve (steroids).  Surgery. Follow these instructions at home: Medicines  Take over-the-counter and prescription medicines only as told by your health care provider.  Ask your health care provider if the medicine prescribed to you: ? Requires you to avoid driving or using heavy machinery. ? Can cause constipation. You may need to take these actions to prevent or treat constipation:  Drink enough fluid to keep your urine pale yellow.  Take over-the-counter or prescription medicines.  Eat foods that are high in fiber, such as beans, whole grains, and fresh fruits and vegetables.  Limit foods that are high in fat and processed sugars, such as fried or sweet foods. Managing pain      If directed, put ice on the affected area. ? Put ice in a plastic bag. ? Place a towel between your skin and the bag. ? Leave the ice on for 20 minutes,   2-3 times a day.  If directed, apply heat to the affected area. Use the heat source that your health care provider recommends, such as a moist heat pack or a heating pad. ? Place a towel between your skin and the heat source. ? Leave the heat on for 20-30 minutes. ? Remove the heat if your skin turns bright red. This is especially important if you are unable to feel pain, heat, or cold. You may have a greater risk of getting burned. Activity   Return to your normal activities as told  by your health care provider. Ask your health care provider what activities are safe for you.  Avoid activities that make your symptoms worse.  Take brief periods of rest throughout the day. ? When you rest for longer periods, mix in some mild activity or stretching between periods of rest. This will help to prevent stiffness and pain. ? Avoid sitting for long periods of time without moving. Get up and move around at least one time each hour.  Exercise and stretch regularly, as told by your health care provider.  Do not lift anything that is heavier than 10 lb (4.5 kg) while you have symptoms of sciatica. When you do not have symptoms, you should still avoid heavy lifting, especially repetitive heavy lifting.  When you lift objects, always use proper lifting technique, which includes: ? Bending your knees. ? Keeping the load close to your body. ? Avoiding twisting. General instructions  Maintain a healthy weight. Excess weight puts extra stress on your back.  Wear supportive, comfortable shoes. Avoid wearing high heels.  Avoid sleeping on a mattress that is too soft or too hard. A mattress that is firm enough to support your back when you sleep may help to reduce your pain.  Keep all follow-up visits as told by your health care provider. This is important. Contact a health care provider if:  You have pain that: ? Wakes you up when you are sleeping. ? Gets worse when you lie down. ? Is worse than you have experienced in the past. ? Lasts longer than 4 weeks.  You have an unexplained weight loss. Get help right away if:  You are not able to control when you urinate or have bowel movements (incontinence).  You have: ? Weakness in your lower back, pelvis, buttocks, or legs that gets worse. ? Redness or swelling of your back. ? A burning sensation when you urinate. Summary  Sciatica is pain, numbness, weakness, or tingling along the path of the sciatic nerve.  This condition  is caused by pressure on the sciatic nerve or pinching of the nerve.  Sciatica can cause pain, numbness, or tingling in the lower back, legs, hips, and buttocks.  Treatment often includes rest, exercise, medicines, and applying ice or heat. This information is not intended to replace advice given to you by your health care provider. Make sure you discuss any questions you have with your health care provider. Document Revised: 12/12/2018 Document Reviewed: 12/12/2018 Elsevier Patient Education  [place].  Back Exercises The following exercises strengthen the muscles that help to support the trunk and back. They also help to keep the lower back flexible. Doing these exercises can help to prevent back pain or lessen existing pain.  If you have back pain or discomfort, try doing these exercises 2-3 times each day or as told by your health care provider.  As your pain improves, do them once each day, but increase the number  of times that you repeat the steps for each exercise (do more repetitions).  To prevent the recurrence of back pain, continue to do these exercises once each day or as told by your health care provider. Do exercises exactly as told by your health care provider and adjust them as directed. It is normal to feel mild stretching, pulling, tightness, or discomfort as you do these exercises, but you should stop right away if you feel sudden pain or your pain gets worse. Exercises Single knee to chest Repeat these steps 3-5 times for each leg: 1. Lie on your back on a firm bed or the floor with your legs extended. 2. Bring one knee to your chest. Your other leg should stay extended and in contact with the floor. 3. Hold your knee in place by grabbing your knee or thigh with both hands and hold. 4. Pull on your knee until you feel a gentle stretch in your lower back or buttocks. 5. Hold the stretch for 10-30 seconds. 6. Slowly release and straighten your leg. Pelvic  tilt Repeat these steps 5-10 times: 1. Lie on your back on a firm bed or the floor with your legs extended. 2. Bend your knees so they are pointing toward the ceiling and your feet are flat on the floor. 3. Tighten your lower abdominal muscles to press your lower back against the floor. This motion will tilt your pelvis so your tailbone points up toward the ceiling instead of pointing to your feet or the floor. 4. With gentle tension and even breathing, hold this position for 5-10 seconds. Cat-cow Repeat these steps until your lower back becomes more flexible: 1. Get into a hands-and-knees position on a firm surface. Keep your hands under your shoulders, and keep your knees under your hips. You may place padding under your knees for comfort. 2. Let your head hang down toward your chest. Contract your abdominal muscles and point your tailbone toward the floor so your lower back becomes rounded like the back of a cat. 3. Hold this position for 5 seconds. 4. Slowly lift your head, let your abdominal muscles relax and point your tailbone up toward the ceiling so your back forms a sagging arch like the back of a cow. 5. Hold this position for 5 seconds.  Press-ups Repeat these steps 5-10 times: 1. Lie on your abdomen (face-down) on the floor. 2. Place your palms near your head, about shoulder-width apart. 3. Keeping your back as relaxed as possible and keeping your hips on the floor, slowly straighten your arms to raise the top half of your body and lift your shoulders. Do not use your back muscles to raise your upper torso. You may adjust the placement of your hands to make yourself more comfortable. 4. Hold this position for 5 seconds while you keep your back relaxed. 5. Slowly return to lying flat on the floor.  Bridges Repeat these steps 10 times: 1. Lie on your back on a firm surface. 2. Bend your knees so they are pointing toward the ceiling and your feet are flat on the floor. Your arms  should be flat at your sides, next to your body. 3. Tighten your buttocks muscles and lift your buttocks off the floor until your waist is at almost the same height as your knees. You should feel the muscles working in your buttocks and the back of your thighs. If you do not feel these muscles, slide your feet 1-2 inches farther away from your buttocks.  4. Hold this position for 3-5 seconds. 5. Slowly lower your hips to the starting position, and allow your buttocks muscles to relax completely. If this exercise is too easy, try doing it with your arms crossed over your chest. Abdominal crunches Repeat these steps 5-10 times: 1. Lie on your back on a firm bed or the floor with your legs extended. 2. Bend your knees so they are pointing toward the ceiling and your feet are flat on the floor. 3. Cross your arms over your chest. 4. Tip your chin slightly toward your chest without bending your neck. 5. Tighten your abdominal muscles and slowly raise your trunk (torso) high enough to lift your shoulder blades a tiny bit off the floor. Avoid raising your torso higher than that because it can put too much stress on your low back and does not help to strengthen your abdominal muscles. 6. Slowly return to your starting position. Back lifts Repeat these steps 5-10 times: 1. Lie on your abdomen (face-down) with your arms at your sides, and rest your forehead on the floor. 2. Tighten the muscles in your legs and your buttocks. 3. Slowly lift your chest off the floor while you keep your hips pressed to the floor. Keep the back of your head in line with the curve in your back. Your eyes should be looking at the floor. 4. Hold this position for 3-5 seconds. 5. Slowly return to your starting position. Contact a health care provider if:  Your back pain or discomfort gets much worse when you do an exercise.  Your worsening back pain or discomfort does not lessen within 2 hours after you exercise. If you have  any of these problems, stop doing these exercises right away. Do not do them again unless your health care provider says that you can. Get help right away if:  You develop sudden, severe back pain. If this happens, stop doing the exercises right away. Do not do them again unless your health care provider says that you can. This information is not intended to replace advice given to you by your health care provider. Make sure you discuss any questions you have with your health care provider. Document Revised: 03/30/2019 Document Reviewed: 08/25/2018 Elsevier Patient Education  [place].  Chronic Back Pain When back pain lasts longer than 3 months, it is called chronic back pain. Pain may get worse at certain times (flare-ups). There are things you can do at home to manage your pain. Follow these instructions at home: Activity      Avoid bending and other activities that make pain worse.  When standing: ? Keep your upper back and neck straight. ? Keep your shoulders pulled back. ? Avoid slouching.  When sitting: ? Keep your back straight. ? Relax your shoulders. Do not round your shoulders or pull them backward.  Do not sit or stand in one place for long periods of time.  Take short rest breaks during the day. Lying down or standing is usually better than sitting. Resting can help relieve pain.  When sitting or lying down for a long time, do some mild activity or stretching. This will help to prevent stiffness and pain.  Get regular exercise. Ask your doctor what activities are safe for you.  Do not lift anything that is heavier than 10 lb (4.5 kg). To prevent injury when you lift things: ? Bend your knees. ? Keep the weight close to your body. ? Avoid twisting. Managing pain  If  told, put ice on the painful area. Your doctor may tell you to use ice for 24-48 hours after a flare-up starts. ? Put ice in a plastic bag. ? Place a towel between your skin and the  bag. ? Leave the ice on for 20 minutes, 2-3 times a day.  If told, put heat on the painful area as often as told by your doctor. Use the heat source that your doctor recommends, such as a moist heat pack or a heating pad. ? Place a towel between your skin and the heat source. ? Leave the heat on for 20-30 minutes. ? Remove the heat if your skin turns bright red. This is especially important if you are unable to feel pain, heat, or cold. You may have a greater risk of getting burned.  Soak in a warm bath. This can help relieve pain.  Take over-the-counter and prescription medicines only as told by your doctor. General instructions  Sleep on a firm mattress. Try lying on your side with your knees slightly bent. If you lie on your back, put a pillow under your knees.  Keep all follow-up visits as told by your doctor. This is important. Contact a doctor if:  You have pain that does not get better with rest or medicine. Get help right away if:  One or both of your arms or legs feel weak.  One or both of your arms or legs lose feeling (numbness).  You have trouble controlling when you poop (bowel movement) or pee (urinate).  You feel sick to your stomach (nauseous).  You throw up (vomit).  You have belly (abdominal) pain.  You have shortness of breath.  You pass out (faint). Summary  When back pain lasts longer than 3 months, it is called chronic back pain.  Pain may get worse at certain times (flare-ups).  Use ice and heat as told by your doctor. Your doctor may tell you to use ice after flare-ups. This information is not intended to replace advice given to you by your health care provider. Make sure you discuss any questions you have with your health care provider. Document Revised: 03/16/2019 Document Reviewed: 07/08/2017 Elsevier Patient Education  2020 [employer].

## 2020-02-23 NOTE — Progress Notes (Signed)
Patient ID: Francisco Hester, male   DOB: 1961/01/02, 59 y.o.   MRN: [id number]  Chief Complaint  Patient presents with  . Back Pain    Back pain.    HPI Francisco Hester is a 59 y.o. male.  59 year old male comes in for evaluation of back and pain in his left gluteal area and hamstring.  He says he hurt his back 12 years ago and has seen his primary care doctor intermittently when it acted up requiring Naprosyn and ibuprofen and that seemed to do well  However he says it got a little bit different over the last couple of weeks to months and he wanted it checked out  He now has pain in his buttock posterior thigh and across his lower back   Review of Systems Review of Systems  Constitutional: Negative for fever.  Gastrointestinal: Negative.   Genitourinary: Negative.   Neurological: Negative.     Past Medical History:  Diagnosis Date  . Hemorrhoids     Past Surgical History:  Procedure Laterality Date  . COLONOSCOPY  2012  . POLYPECTOMY    . WISDOM TOOTH EXTRACTION      Family History  Problem Relation Age of Onset  . COPD Mother   . Dementia Father   . Hypertension Father   . COPD Brother   . Stroke Maternal Grandmother   . Colon cancer Neg Hx    was reviewed  Social History Social History   Tobacco Use  . Smoking status: Never Smoker  . Smokeless tobacco: Never Used  Substance Use Topics  . Alcohol use: No    Alcohol/week: 0.0 standard drinks  . Drug use: No    No Known Allergies  Current Outpatient Medications  Medication Sig Dispense Refill  . Biotin 1 MG CAPS Take 1 tablet by mouth daily.     . Cholecalciferol (VITAMIN D PO) Take by mouth.    . Multiple Vitamin (MULTIVITAMIN) tablet Take 1 tablet by mouth daily.     No current facility-administered medications for this visit.       Physical Exam Temp (!) 97.3 F (36.3 C)   Ht 6' (1.829 m)   Wt 197 lb (89.4 kg)   BMI 26.72 kg/m  Physical Exam Gen. appearance: The patient is  well-developed and well-nourished grooming and hygiene are normal The patient is oriented to person place and time The patient's mood is normal and the affect is normal  Ortho Exam Gait assessment: The patient stands with normal gait and station  Lumbar spine Tenderness  to palpation is noted in the lower L4-5 and 5 S1 segment  Range of motion normal flexion but pain with extension Muscle tone normal muscle tone on the right and left sides of the spine  Lower extremities right and left Normal range of motion hip knee and ankle All 3 joints are reduced and stable  Strength right lower extremity normal grade 5 Strength left lower extremity normal grade 5  Neurologic right lower extremity examination  Reflexes were 2+ and equal at the knee and 1+ and equal at the ankle    Sensation was normal in both feet and legs    Babinski's tests were down going  Straight leg raise testing was negative normal bilaterally  The vascular examination revealed normal dorsalis pedis pulses in both feet and both feet were warm with good capillary refill    MEDICAL DECISION MAKING  A.  Encounter Diagnosis  Name Primary?  . Chronic left-sided low  back pain with left-sided sciatica Yes    B. 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 MANAGEMENT  Patient education  Come back if symptoms warrant such as intractable pain numbness and tingling in the foot weakness in the left foot bowel or bladder dysfunction  No orders of the defined types were placed in this encounter.

## 2020-03-04 ENCOUNTER — Other Ambulatory Visit: Payer: Self-pay

## 2020-03-04 ENCOUNTER — Ambulatory Visit
Admission: EM | Admit: 2020-03-04 | Discharge: 2020-03-04 | Disposition: A | Discharge status: Home / Self Care | Payer: 59 | Attending: Emergency Medicine | Admitting: Emergency Medicine

## 2020-03-04 ENCOUNTER — Encounter: Payer: Self-pay | Admitting: Emergency Medicine

## 2020-03-04 DIAGNOSIS — S30861A Insect bite (nonvenomous) of abdominal wall, initial encounter: Secondary | ICD-10-CM

## 2020-03-04 DIAGNOSIS — W57XXXA Bitten or stung by nonvenomous insect and other nonvenomous arthropods, initial encounter: Secondary | ICD-10-CM | POA: Diagnosis not present

## 2020-03-04 MED ORDER — TRIAMCINOLONE ACETONIDE 0.1 % EX CREA: 1.0000 "application " | TOPICAL_CREAM | Freq: Two times a day (BID) | CUTANEOUS | 0 refills | Status: DC

## 2020-03-04 MED ORDER — MUPIROCIN 2 % EX OINT: 1.0000 "application " | TOPICAL_OINTMENT | Freq: Two times a day (BID) | CUTANEOUS | 0 refills | Status: DC

## 2020-03-04 MED ORDER — DOXYCYCLINE HYCLATE 100 MG PO TABS
200.0000 mg | ORAL_TABLET | Freq: Once | ORAL | Status: AC
  Administered 2020-03-04: 200 mg via ORAL

## 2020-03-04 NOTE — ED Provider Notes (Signed)
[facility]   [id number] 03/04/20 Arrival Time: [id number]  CC: tick bite  SUBJECTIVE:  Francisco Hester is a 59 y.o. male complains of tick bite to LT lower abdomen/ flank x 2 days.  Localizes bite to his LT lower abdomen with redness and swelling.  Removed tick at home.  Denies tick was attached for >72 hours, and was not engorged when it was removed.  Reports previous hx of tick bite, but without this reaction.  Reports swelling and redness.  Denies fever, chills, nausea, vomiting, headache, dizziness, weakness, fatigue, rash, or abdominal pain.     ROS: As per HPI.  All other pertinent ROS negative.     Past Medical History:  Diagnosis Date  . Hemorrhoids    Past Surgical History:  Procedure Laterality Date  . COLONOSCOPY  2012  . POLYPECTOMY    . WISDOM TOOTH EXTRACTION     No Known Allergies No current facility-administered medications on file prior to encounter.   Current Outpatient Medications on File Prior to Encounter  Medication Sig Dispense Refill  . Biotin 1 MG CAPS Take 1 tablet by mouth daily.     . Cholecalciferol (VITAMIN D PO) Take by mouth.    . Multiple Vitamin (MULTIVITAMIN) tablet Take 1 tablet by mouth daily.     Social History   Socioeconomic History  . Marital status: Married    Spouse name: Not on file  . Number of children: Not on file  . Years of education: Not on file  . Highest education level: Not on file  Occupational History  . Not on file  Tobacco Use  . Smoking status: Never Smoker  . Smokeless tobacco: Never Used  Substance and Sexual Activity  . Alcohol use: No    Alcohol/week: 0.0 standard drinks  . Drug use: No  . Sexual activity: Yes  Other Topics Concern  . Not on file  Social History Narrative  . Not on file   Social Determinants of Health   Financial Resource Strain:   . Difficulty of Paying Living Expenses:   Food Insecurity:   . Worried About Charity fundraiser in the Last Year:   . Arboriculturist in  the Last Year:   Transportation Needs:   . Film/video editor (Medical):   Marland Kitchen Lack of Transportation (Non-Medical):   Physical Activity:   . Days of Exercise per Week:   . Minutes of Exercise per Session:   Stress:   . Feeling of Stress :   Social Connections:   . Frequency of Communication with Friends and Family:   . Frequency of Social Gatherings with Friends and Family:   . Attends Religious Services:   . Active Member of Clubs or Organizations:   . Attends Archivist Meetings:   Marland Kitchen Marital Status:   Intimate Partner Violence:   . Fear of Current or Ex-Partner:   . Emotionally Abused:   Marland Kitchen Physically Abused:   . Sexually Abused:    Family History  Problem Relation Age of Onset  . COPD Mother   . Dementia Father   . Hypertension Father   . COPD Brother   . Stroke Maternal Grandmother   . Colon cancer Neg Hx     OBJECTIVE: Vitals:   03/04/20 1651  BP: 131/81  Pulse: 61  Resp: 18  Temp: 97.9 F (36.6 C)  TempSrc: Oral  SpO2: 95%  Weight: 197 lb (89.4 kg)  Height: 6' (1.829 m)  General appearance: alert; no distress Head: NCAT Eye: EOMI grossly Lungs: normal respiratory effort Skin: warm and dry; 4 x 4 cm area of erythema and swelling localized to LT lower lateral abdomen/ flank, NTTP, no obvious drainage or swelling, no retained tick particles Neuro: Ambulates without difficulty Psychological: alert and cooperative; normal mood and affect  ASSESSMENT & PLAN:  1. Tick bite of abdomen, initial encounter     Meds ordered this encounter  Medications  . triamcinolone cream (KENALOG) 0.1 %    Sig: Apply 1 application topically 2 (two) times daily.    Dispense:  30 g    Refill:  0    Order Specific Question:   Supervising Provider    Answer:   Raylene Everts [id number]  . mupirocin ointment (BACTROBAN) 2 %    Sig: Place 1 application into the nose 2 (two) times daily.    Dispense:  22 g    Refill:  0    Order Specific Question:    Supervising Provider    Answer:   Raylene Everts [id number]  . doxycycline (VIBRA-TABS) tablet 200 mg    Wash with warm water and mild soap Keep covered Avoid scratching Given doxycycline 200 mg Triamcinolone cream for itching Bactroban ointment to prevent infection To prevent tick bites, wear long sleeves, long pants, and light colors. Use insect repellent. Follow the instructions on the bottle. If the tick is biting, do not try to remove it with heat, alcohol, petroleum jelly, or fingernail polish. Use tweezers, curved forceps, or a tick-removal tool to grasp the tick. Gently pull up until the tick lets go. Do not twist or jerk the tick. Do not squeeze or crush the tick. Return here or go to ER if you have any new or worsening symptoms (rash, nausea, vomiting, fever, chills, headache, fatigue, etc...)  Reviewed expectations re: course of current medical issues. Questions answered. Outlined signs and symptoms indicating need for more acute intervention. Patient verbalized understanding. After Visit Summary given.   Lestine Box, PA-C 03/04/20 1705

## 2020-03-04 NOTE — ED Triage Notes (Signed)
Pt was bit by a tick on left flank area on saturday. Area red and swelling noted

## 2020-03-04 NOTE — Discharge Instructions (Signed)
Wash with warm water and mild soap Keep covered Avoid scratching Given doxycycline 200 mg Triamcinolone cream for itching Bactroban ointment to prevent infection To prevent tick bites, wear long sleeves, long pants, and light colors. Use insect repellent. Follow the instructions on the bottle. If the tick is biting, do not try to remove it with heat, alcohol, petroleum jelly, or fingernail polish. Use tweezers, curved forceps, or a tick-removal tool to grasp the tick. Gently pull up until the tick lets go. Do not twist or jerk the tick. Do not squeeze or crush the tick. Return here or go to ER if you have any new or worsening symptoms (rash, nausea, vomiting, fever, chills, headache, fatigue, etc...)

## 2020-03-06 ENCOUNTER — Telehealth: Payer: Self-pay | Admitting: Family Medicine

## 2020-03-06 DIAGNOSIS — Z79899 Other long term (current) drug therapy: Secondary | ICD-10-CM

## 2020-03-06 DIAGNOSIS — Z125 Encounter for screening for malignant neoplasm of prostate: Secondary | ICD-10-CM

## 2020-03-06 DIAGNOSIS — E785 Hyperlipidemia, unspecified: Secondary | ICD-10-CM

## 2020-03-06 DIAGNOSIS — Z0000 Encounter for general adult medical examination without abnormal findings: Secondary | ICD-10-CM

## 2020-03-06 NOTE — Telephone Encounter (Signed)
Lab orders placed. Left detailed message on voicemail (DPR)

## 2020-03-06 NOTE — Telephone Encounter (Signed)
Pt has CPE on 6/1 and would like lab work done before appt.

## 2020-03-06 NOTE — Telephone Encounter (Signed)
Met 7 lipid liver PSA

## 2020-03-06 NOTE — Telephone Encounter (Signed)
Last labs completed on 09/06/2019 CBC and Lipid 05/26/2019 PSA, BMET, Hepatic and lipid. Please advise. Thank you

## 2020-04-30 LAB — BASIC METABOLIC PANEL
BUN/Creatinine Ratio: 13 (ref 10–24)
BUN: 15 mg/dL (ref 8–27)
CO2: 25 mmol/L (ref 20–29)
Calcium: 10 mg/dL (ref 8.6–10.2)
Chloride: 101 mmol/L (ref 96–106)
Creatinine, Ser: 1.18 mg/dL (ref 0.76–1.27)
GFR calc Af Amer: 77 mL/min/{1.73_m2} (ref >59)
GFR calc non Af Amer: 67 mL/min/{1.73_m2} (ref >59)
Glucose: 88 mg/dL (ref 65–99)
Potassium: 4.2 mmol/L (ref 3.5–5.2)
Sodium: 140 mmol/L (ref 134–144)

## 2020-04-30 LAB — LIPID PANEL
Chol/HDL Ratio: 4.3 ratio (ref 0.0–5.0)
Cholesterol, Total: 189 mg/dL (ref 100–199)
HDL: 44 mg/dL (ref >39)
LDL Chol Calc (NIH): 117 mg/dL — ABNORMAL HIGH (ref 0–99)
Triglycerides: 160 mg/dL — ABNORMAL HIGH (ref 0–149)
VLDL Cholesterol Cal: 28 mg/dL (ref 5–40)

## 2020-04-30 LAB — HEPATIC FUNCTION PANEL
ALT: 20 IU/L (ref 0–44)
AST: 25 IU/L (ref 0–40)
Albumin: 4.8 g/dL (ref 3.8–4.9)
Alkaline Phosphatase: 78 IU/L (ref 48–121)
Bilirubin Total: 0.6 mg/dL (ref 0.0–1.2)
Bilirubin, Direct: 0.16 mg/dL (ref 0.00–0.40)
Total Protein: 6.7 g/dL (ref 6.0–8.5)

## 2020-04-30 LAB — PSA: Prostate Specific Ag, Serum: 2.3 ng/mL (ref 0.0–4.0)

## 2020-05-07 ENCOUNTER — Ambulatory Visit (INDEPENDENT_AMBULATORY_CARE_PROVIDER_SITE_OTHER): Payer: 59 | Admitting: Family Medicine

## 2020-05-07 ENCOUNTER — Other Ambulatory Visit: Payer: Self-pay

## 2020-05-07 ENCOUNTER — Encounter: Payer: Self-pay | Admitting: Family Medicine

## 2020-05-07 VITALS — BP 132/88 | Temp 97.4°F | Ht 72.0 in | Wt 196.4 lb

## 2020-05-07 DIAGNOSIS — Z0000 Encounter for general adult medical examination without abnormal findings: Secondary | ICD-10-CM

## 2020-05-07 NOTE — Patient Instructions (Signed)
Cholesterol Content in Foods Cholesterol is a waxy, fat-like substance that helps to carry fat in the blood. The body needs cholesterol in small amounts, but too much cholesterol can cause damage to the arteries and heart. Most people should eat less than 200 milligrams (mg) of cholesterol a day. Foods with cholesterol  Cholesterol is found in animal-based foods, such as meat, seafood, and dairy. Generally, low-fat dairy and lean meats have less cholesterol than full-fat dairy and fatty meats. The milligrams of cholesterol per serving (mg per serving) of common cholesterol-containing foods are listed below. Meat and other proteins  Egg -- one large whole egg has 186 mg.  Veal shank -- 4 oz has 141 mg.  Lean ground turkey (93% lean) -- 4 oz has 118 mg.  Fat-trimmed lamb loin -- 4 oz has 106 mg.  Lean ground beef (90% lean) -- 4 oz has 100 mg.  Lobster -- 3.5 oz has 90 mg.  Pork loin chops -- 4 oz has 86 mg.  Canned salmon -- 3.5 oz has 83 mg.  Fat-trimmed beef top loin -- 4 oz has 78 mg.  Frankfurter -- 1 frank (3.5 oz) has 77 mg.  Crab -- 3.5 oz has 71 mg.  Roasted chicken without skin, white meat -- 4 oz has 66 mg.  Light bologna -- 2 oz has 45 mg.  Deli-cut turkey -- 2 oz has 31 mg.  Canned tuna -- 3.5 oz has 31 mg.  Bacon -- 1 oz has 29 mg.  Oysters and mussels (raw) -- 3.5 oz has 25 mg.  Mackerel -- 1 oz has 22 mg.  Trout -- 1 oz has 20 mg.  Pork sausage -- 1 link (1 oz) has 17 mg.  Salmon -- 1 oz has 16 mg.  Tilapia -- 1 oz has 14 mg. Dairy  Soft-serve ice cream --  cup (4 oz) has 103 mg.  Whole-milk yogurt -- 1 cup (8 oz) has 29 mg.  Cheddar cheese -- 1 oz has 28 mg.  American cheese -- 1 oz has 28 mg.  Whole milk -- 1 cup (8 oz) has 23 mg.  2% milk -- 1 cup (8 oz) has 18 mg.  Cream cheese -- 1 tablespoon (Tbsp) has 15 mg.  Cottage cheese --  cup (4 oz) has 14 mg.  Low-fat (1%) milk -- 1 cup (8 oz) has 10 mg.  Sour cream -- 1 Tbsp has 8.5  mg.  Low-fat yogurt -- 1 cup (8 oz) has 8 mg.  Nonfat Greek yogurt -- 1 cup (8 oz) has 7 mg.  Half-and-half cream -- 1 Tbsp has 5 mg. Fats and oils  Cod liver oil -- 1 tablespoon (Tbsp) has 82 mg.  Butter -- 1 Tbsp has 15 mg.  Lard -- 1 Tbsp has 14 mg.  Bacon grease -- 1 Tbsp has 14 mg.  Mayonnaise -- 1 Tbsp has 5-10 mg.  Margarine -- 1 Tbsp has 3-10 mg. Exact amounts of cholesterol in these foods may vary depending on specific ingredients and brands. Foods without cholesterol Most plant-based foods do not have cholesterol unless you combine them with a food that has cholesterol. Foods without cholesterol include:  Grains and cereals.  Vegetables.  Fruits.  Vegetable oils, such as olive, canola, and sunflower oil.  Legumes, such as peas, beans, and lentils.  Nuts and seeds.  Egg whites. Summary  The body needs cholesterol in small amounts, but too much cholesterol can cause damage to the arteries and heart.    Most people should eat less than 200 milligrams (mg) of cholesterol a day. °This information is not intended to replace advice given to you by your health care provider. Make sure you discuss any questions you have with your health care provider. °Document Revised: 11/05/2017 Document Reviewed: 07/20/2017 °Elsevier Patient Education © 2020 [employer] ° ° ° °DASH Eating Plan °DASH stands for "Dietary Approaches to Stop Hypertension." The DASH eating plan is a healthy eating plan that has been shown to reduce high blood pressure (hypertension). It may also reduce your risk for type 2 diabetes, heart disease, and stroke. The DASH eating plan may also help with weight loss. °What are tips for following this plan? ° °General guidelines °· Avoid eating more than 2,300 mg (milligrams) of salt (sodium) a day. If you have hypertension, you may need to reduce your sodium intake to 1,500 mg a day. °· Limit alcohol intake to no more than 1 drink a day for nonpregnant women and 2  drinks a day for men. One drink equals 12 oz of beer, 5 oz of wine, or 1½ oz of hard liquor. °· Work with your health care provider to maintain a healthy body weight or to lose weight. Ask what an ideal weight is for you. °· Get at least 30 minutes of exercise that causes your heart to beat faster (aerobic exercise) most days of the week. Activities may include walking, swimming, or biking. °· Work with your health care provider or diet and nutrition specialist (dietitian) to adjust your eating plan to your individual calorie needs. °Reading food labels ° °· Check food labels for the amount of sodium per serving. Choose foods with less than 5 percent of the Daily Value of sodium. Generally, foods with less than 300 mg of sodium per serving fit into this eating plan. °· To find whole grains, look for the word "whole" as the first word in the ingredient list. °Shopping °· Buy products labeled as "low-sodium" or "no salt added." °· Buy fresh foods. Avoid canned foods and premade or frozen meals. °Cooking °· Avoid adding salt when cooking. Use salt-free seasonings or herbs instead of table salt or sea salt. Check with your health care provider or pharmacist before using salt substitutes. °· Do not fry foods. Cook foods using healthy methods such as baking, boiling, grilling, and broiling instead. °· Cook with heart-healthy oils, such as olive, canola, soybean, or sunflower oil. °Meal planning °· Eat a balanced diet that includes: °? 5 or more servings of fruits and vegetables each day. At each meal, try to fill half of your plate with fruits and vegetables. °? Up to 6-8 servings of whole grains each day. °? Less than 6 oz of lean meat, poultry, or fish each day. A 3-oz serving of meat is about the same size as a deck of cards. One egg equals 1 oz. °? 2 servings of low-fat dairy each day. °? A serving of nuts, seeds, or beans 5 times each week. °? Heart-healthy fats. Healthy fats called Omega-3 fatty acids are found in  foods such as flaxseeds and coldwater fish, like sardines, salmon, and mackerel. °· Limit how much you eat of the following: °? Canned or prepackaged foods. °? Food that is high in trans fat, such as fried foods. °? Food that is high in saturated fat, such as fatty meat. °? Sweets, desserts, sugary drinks, and other foods with added sugar. °? Full-fat dairy products. °· Do not salt foods before eating. °· Try   to eat at least 2 vegetarian meals each week. °· Eat more home-cooked food and less restaurant, buffet, and fast food. °· When eating at a restaurant, ask that your food be prepared with less salt or no salt, if possible. °What foods are recommended? °The items listed may not be a complete list. Talk with your dietitian about what dietary choices are best for you. °Grains °Whole-grain or whole-wheat bread. Whole-grain or whole-wheat pasta. Brown rice. Oatmeal. Quinoa. Bulgur. Whole-grain and low-sodium cereals. Pita bread. Low-fat, low-sodium crackers. Whole-wheat flour tortillas. °Vegetables °Fresh or frozen vegetables (raw, steamed, roasted, or grilled). Low-sodium or reduced-sodium tomato and vegetable juice. Low-sodium or reduced-sodium tomato sauce and tomato paste. Low-sodium or reduced-sodium canned vegetables. °Fruits °All fresh, dried, or frozen fruit. Canned fruit in natural juice (without added sugar). °Meat and other protein foods °Skinless chicken or turkey. Ground chicken or turkey. Pork with fat trimmed off. Fish and seafood. Egg whites. Dried beans, peas, or lentils. Unsalted nuts, nut butters, and seeds. Unsalted canned beans. Lean cuts of beef with fat trimmed off. Low-sodium, lean deli meat. °Dairy °Low-fat (1%) or fat-free (skim) milk. Fat-free, low-fat, or reduced-fat cheeses. Nonfat, low-sodium ricotta or cottage cheese. Low-fat or nonfat yogurt. Low-fat, low-sodium cheese. °Fats and oils °Soft margarine without trans fats. Vegetable oil. Low-fat, reduced-fat, or light mayonnaise and salad  dressings (reduced-sodium). Canola, safflower, olive, soybean, and sunflower oils. Avocado. °Seasoning and other foods °Herbs. Spices. Seasoning mixes without salt. Unsalted popcorn and pretzels. Fat-free sweets. °What foods are not recommended? °The items listed may not be a complete list. Talk with your dietitian about what dietary choices are best for you. °Grains °Baked goods made with fat, such as croissants, muffins, or some breads. Dry pasta or rice meal packs. °Vegetables °Creamed or fried vegetables. Vegetables in a cheese sauce. Regular canned vegetables (not low-sodium or reduced-sodium). Regular canned tomato sauce and paste (not low-sodium or reduced-sodium). Regular tomato and vegetable juice (not low-sodium or reduced-sodium). Pickles. Olives. °Fruits °Canned fruit in a light or heavy syrup. Fried fruit. Fruit in cream or butter sauce. °Meat and other protein foods °Fatty cuts of meat. Ribs. Fried meat. Bacon. Sausage. Bologna and other processed lunch meats. Salami. Fatback. Hotdogs. Bratwurst. Salted nuts and seeds. Canned beans with added salt. Canned or smoked fish. Whole eggs or egg yolks. Chicken or turkey with skin. °Dairy °Whole or 2% milk, cream, and half-and-half. Whole or full-fat cream cheese. Whole-fat or sweetened yogurt. Full-fat cheese. Nondairy creamers. Whipped toppings. Processed cheese and cheese spreads. °Fats and oils °Butter. Stick margarine. Lard. Shortening. Ghee. Bacon fat. Tropical oils, such as coconut, palm kernel, or palm oil. °Seasoning and other foods °Salted popcorn and pretzels. Onion salt, garlic salt, seasoned salt, table salt, and sea salt. Worcestershire sauce. Tartar sauce. Barbecue sauce. Teriyaki sauce. Soy sauce, including reduced-sodium. Steak sauce. Canned and packaged gravies. Fish sauce. Oyster sauce. Cocktail sauce. Horseradish that you find on the shelf. Ketchup. Mustard. Meat flavorings and tenderizers. Bouillon cubes. Hot sauce and Tabasco sauce.  Premade or packaged marinades. Premade or packaged taco seasonings. Relishes. Regular salad dressings. °Where to find more information: °· National Heart, Lung, and Blood Institute: www.nhlbi.nih.gov °· American Heart Association: www.heart.org °Summary °· The DASH eating plan is a healthy eating plan that has been shown to reduce high blood pressure (hypertension). It may also reduce your risk for type 2 diabetes, heart disease, and stroke. °· With the DASH eating plan, you should limit salt (sodium) intake to 2,300 mg a day. If you   have hypertension, you may need to reduce your sodium intake to 1,500 mg a day. °· When on the DASH eating plan, aim to eat more fresh fruits and vegetables, whole grains, lean proteins, low-fat dairy, and heart-healthy fats. °· Work with your health care provider or diet and nutrition specialist (dietitian) to adjust your eating plan to your individual calorie needs. °This information is not intended to replace advice given to you by your health care provider. Make sure you discuss any questions you have with your health care provider. °Document Revised: 11/05/2017 Document Reviewed: 11/16/2016 °Elsevier Patient Education © 2020 [employer] Inc. ° °

## 2020-05-07 NOTE — Progress Notes (Signed)
Subjective:    Patient ID: Francisco Hester, male    DOB: 09-Jul-1961, 59 y.o.   MRN: [id number]  HPI The patient comes in today for a wellness visit.  Results for orders placed or performed in visit on [id number]  Basic Metabolic Panel (BMET)  Result Value Ref Range   Glucose 88 65 - 99 mg/dL   BUN 15 8 - 27 mg/dL   Creatinine, Ser 1.18 0.76 - 1.27 mg/dL   GFR calc non Af Amer 67 >59 mL/min/1.73   GFR calc Af Amer 77 >59 mL/min/1.73   BUN/Creatinine Ratio 13 10 - 24   Sodium 140 134 - 144 mmol/L   Potassium 4.2 3.5 - 5.2 mmol/L   Chloride 101 96 - 106 mmol/L   CO2 25 20 - 29 mmol/L   Calcium 10.0 8.6 - 10.2 mg/dL  Lipid Profile  Result Value Ref Range   Cholesterol, Total 189 100 - 199 mg/dL   Triglycerides 160 (H) 0 - 149 mg/dL   HDL 44 >39 mg/dL   VLDL Cholesterol Cal 28 5 - 40 mg/dL   LDL Chol Calc (NIH) 117 (H) 0 - 99 mg/dL   Chol/HDL Ratio 4.3 0.0 - 5.0 ratio  Hepatic function panel  Result Value Ref Range   Total Protein 6.7 6.0 - 8.5 g/dL   Albumin 4.8 3.8 - 4.9 g/dL   Bilirubin Total 0.6 0.0 - 1.2 mg/dL   Bilirubin, Direct 0.16 0.00 - 0.40 mg/dL   Alkaline Phosphatase 78 48 - 121 IU/L   AST 25 0 - 40 IU/L   ALT 20 0 - 44 IU/L  PSA  Result Value Ref Range   Prostate Specific Ag, Serum 2.3 0.0 - 4.0 ng/mL     A review of their health history was completed.  A review of medications was also completed.  Any needed refills; none  Eating habits: health conscious  Falls/  MVA accidents in past few months: none  Regular exercise: run/walk 5 days a week, lift weights 3 days per week   Specialist pt sees on regular basis: none  Preventative health issues were discussed.   Additional concerns: none  Results for orders placed or performed in visit on [id number]  Basic Metabolic Panel (BMET)  Result Value Ref Range   Glucose 88 65 - 99 mg/dL   BUN 15 8 - 27 mg/dL   Creatinine, Ser 1.18 0.76 - 1.27 mg/dL   GFR calc non Af Amer 67 >59 mL/min/1.73   GFR calc  Af Amer 77 >59 mL/min/1.73   BUN/Creatinine Ratio 13 10 - 24   Sodium 140 134 - 144 mmol/L   Potassium 4.2 3.5 - 5.2 mmol/L   Chloride 101 96 - 106 mmol/L   CO2 25 20 - 29 mmol/L   Calcium 10.0 8.6 - 10.2 mg/dL  Lipid Profile  Result Value Ref Range   Cholesterol, Total 189 100 - 199 mg/dL   Triglycerides 160 (H) 0 - 149 mg/dL   HDL 44 >39 mg/dL   VLDL Cholesterol Cal 28 5 - 40 mg/dL   LDL Chol Calc (NIH) 117 (H) 0 - 99 mg/dL   Chol/HDL Ratio 4.3 0.0 - 5.0 ratio  Hepatic function panel  Result Value Ref Range   Total Protein 6.7 6.0 - 8.5 g/dL   Albumin 4.8 3.8 - 4.9 g/dL   Bilirubin Total 0.6 0.0 - 1.2 mg/dL   Bilirubin, Direct 0.16 0.00 - 0.40 mg/dL   Alkaline Phosphatase 78 48 - 121 IU/L  AST 25 0 - 40 IU/L   ALT 20 0 - 44 IU/L  PSA  Result Value Ref Range   Prostate Specific Ag, Serum 2.3 0.0 - 4.0 ng/mL     Review of Systems  Constitutional: Negative for activity change, appetite change and fever.  HENT: Negative for congestion and rhinorrhea.   Eyes: Negative for discharge.  Respiratory: Negative for cough and wheezing.   Cardiovascular: Negative for chest pain.  Gastrointestinal: Negative for abdominal pain, blood in stool and vomiting.  Genitourinary: Negative for difficulty urinating and frequency.  Musculoskeletal: Negative for neck pain.  Skin: Negative for rash.  Allergic/Immunologic: Negative for environmental allergies and food allergies.  Neurological: Negative for weakness and headaches.  Psychiatric/Behavioral: Negative for agitation.       Objective:   Physical Exam Constitutional:      Appearance: He is well-developed.  HENT:     Head: Normocephalic and atraumatic.     Right Ear: External ear normal.     Left Ear: External ear normal.     Nose: Nose normal.  Eyes:     Pupils: Pupils are equal, round, and reactive to light.  Neck:     Thyroid: No thyromegaly.  Cardiovascular:     Rate and Rhythm: Normal rate and regular rhythm.     Heart  sounds: Normal heart sounds. No murmur.  Pulmonary:     Effort: Pulmonary effort is normal. No respiratory distress.     Breath sounds: Normal breath sounds. No wheezing.  Abdominal:     General: Bowel sounds are normal. There is no distension.     Palpations: Abdomen is soft. There is no mass.     Tenderness: There is no abdominal tenderness.  Genitourinary:    Penis: Normal.   Musculoskeletal:        General: Normal range of motion.     Cervical back: Normal range of motion and neck supple.  Lymphadenopathy:     Cervical: No cervical adenopathy.  Skin:    General: Skin is warm and dry.     Findings: No erythema.  Neurological:     Mental Status: He is alert.     Motor: No abnormal muscle tone.  Psychiatric:        Behavior: Behavior normal.        Judgment: Judgment normal.    Prostate exam slightly enlarged but soft nontender       Assessment & Plan:  Patient overall in good health and condition LDL needs to be below 100 if possible Patient does not want to be on medication currently He will work hard on diet repeat cholesterol profile again later this year He is up-to-date on his colonoscopy He did have his Covid vaccine   Adult wellness-complete.wellness physical was conducted today. Importance of diet and exercise were discussed in detail.  In addition to this a discussion regarding safety was also covered. We also reviewed over immunizations and gave recommendations regarding current immunization needed for age.  In addition to this additional areas were also touched on including: Preventative health exams needed:  Colonoscopy Due 2022  Patient was advised yearly wellness exam

## 2020-07-29 ENCOUNTER — Telehealth: Payer: Self-pay | Admitting: Orthopedic Surgery

## 2020-07-29 NOTE — Telephone Encounter (Signed)
Patient states he had spoken with Dr Aline Brochure, outside office, and that patient had mentioned possible steroid injections in the discussion with Dr Aline Brochure about his back. I offered next available appointment, after 08/26/20 - any recommendations?  Patient's last visit was 02/23/2020.  Please advise.

## 2020-07-30 NOTE — Telephone Encounter (Signed)
Called back to patient; offered/scheduled; patient aware.

## 2020-07-30 NOTE — Telephone Encounter (Signed)
I see sept 9 at 9

## 2020-08-15 ENCOUNTER — Encounter: Payer: Self-pay | Admitting: Orthopedic Surgery

## 2020-08-15 ENCOUNTER — Other Ambulatory Visit: Payer: Self-pay

## 2020-08-15 ENCOUNTER — Ambulatory Visit (INDEPENDENT_AMBULATORY_CARE_PROVIDER_SITE_OTHER): Payer: 59 | Admitting: Orthopedic Surgery

## 2020-08-15 VITALS — BP 143/99 | HR 61 | Ht 72.0 in | Wt 192.0 lb

## 2020-08-15 DIAGNOSIS — M5442 Lumbago with sciatica, left side: Secondary | ICD-10-CM

## 2020-08-15 DIAGNOSIS — G8929 Other chronic pain: Secondary | ICD-10-CM | POA: Diagnosis not present

## 2020-08-15 NOTE — Progress Notes (Signed)
Chief Complaint  Patient presents with  . Back Pain    low back pain    Novak is noted to have back pain specially on the right side mainly in the gluteal region and was sent for some stretching strengthening exercises which she is done he was able to run 3 miles today but has occasional episodes of severe back pain across the lower part of his back and radiates into the upper right thigh.  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  . Chronic left-sided low back pain with left-sided sciatica Yes

## 2020-08-15 NOTE — Patient Instructions (Addendum)
Schedule mri lumbar    Epidural Steroid Injection  An epidural steroid injection is a shot of steroid medicine and numbing medicine that is given into the space between the spinal cord and the bones of the back (epidural space). The shot helps relieve pain caused by an irritated or swollen nerve root. The amount of pain relief you get from the injection depends on what is causing the nerve to be swollen and irritated, and how long your pain lasts. You are more likely to benefit from this injection if your pain is strong and comes on suddenly rather than if you have had long-term (chronic) pain. Tell a health care provider about:  Any allergies you have.  All medicines you are taking, including vitamins, herbs, eye drops, creams, and over-the-counter medicines.  Any problems you or family members have had with anesthetic medicines.  Any blood disorders you have.  Any surgeries you have had.  Any medical conditions you have.  Whether you are pregnant or may be pregnant. What are the risks? Generally, this is a safe procedure. However, problems may occur, including:  Headache.  Bleeding.  Infection.  Allergic reaction to medicines.  Nerve damage. What happens before the procedure? Staying hydrated Follow instructions from your health care provider about hydration, which may include:  Up to 2 hours before the procedure - you may continue to drink clear liquids, such as water, clear fruit juice, black coffee, and plain tea. Eating and drinking restrictions Follow instructions from your health care provider about eating and drinking, which may include:  8 hours before the procedure - stop eating heavy meals or foods, such as meat, fried foods, or fatty foods.  6 hours before the procedure - stop eating light meals or foods, such as toast or cereal.  6 hours before the procedure - stop drinking milk or drinks that contain milk.  2 hours before the procedure - stop drinking  clear liquids. Medicines  You may be given medicines to lower anxiety.  Ask your health care provider about: ? Changing or stopping your regular medicines. This is especially important if you are taking diabetes medicines or blood thinners. ? Taking medicines such as aspirin and ibuprofen. These medicines can thin your blood. Do not take these medicines unless your health care provider tells you to take them. ? Taking over-the-counter medicines, vitamins, herbs, and supplements.  Ask your health care provider what steps will be taken to prevent infection. General instructions  Plan to have someone take you home from the hospital or clinic.  If you will be going home right after the procedure, plan to have someone with you for 24 hours. What happens during the procedure?  An IV will be inserted into one of your veins.  You will be given one or more of the following: ? A medicine to help you relax (sedative). ? A medicine to numb the area (local anesthetic).  You will be asked to lie on your abdomen or sit.  The injection site will be cleaned.  A needle will be inserted through your skin into the epidural space. This may cause you some discomfort. An X-ray machine will be used to guide the needle as close as possible to the affected nerve.  A steroid medicine and a local anesthetic will be injected into the epidural space.  The needle and IV will be removed.  A bandage (dressing) will be put over the injection site. The procedure may vary among health care providers and hospitals.  What can I expect after the procedure? Follow these instructions at home: Injection site care  You may remove the bandage (dressing) after 24 hours.  Check your injection site every day for signs of infection. Check for: ? Redness, swelling, or pain. ? Fluid or blood. ? Warmth. ? Pus or a bad smell. Managing pain, stiffness, and swelling  For 24 hours after the procedure: ? Avoid using heat  on the injection site. ? Do not take baths, swim, or use a hot tub until your health care provider approves. Ask your health care provider if you may take a shower. You may only be allowed to take sponge baths.  If directed, put ice on the injection site. To do this: ? Put ice in a plastic bag. ? Place a towel between your skin and the bag. ? Leave the ice on for 20 minutes, 2-3 times a day.  Activity  Do not drive for 24 hours if you were given a sedative during your procedure.  Return to your normal activities as told by your health care provider. Ask your health care provider what activities are safe for you. General instructions  Your blood pressure, heart rate, breathing rate, and blood oxygen level will be monitored until you leave the hospital or clinic.  Your arm or leg may feel weak or numb for a few hours.  The injection site may feel sore.  Take over-the-counter and prescription medicines only as told by your health care provider.  Drink enough fluid to keep your urine pale yellow.  Keep all follow-up visits as told by your health care provider. This is important. Contact a health care provider if:  You have any of these signs of infection: ? Redness, swelling, or pain around your injection site. ? Fluid or blood coming from your injection site. ? Warmth coming from your injection site. ? Pus or a bad smell coming from your injection site. ? A fever.  You continue to have pain and soreness around the injection site, even after taking over-the-counter pain medicine.  You have severe, sudden, or lasting nausea or vomiting. Get help right away if:  You have severe pain at the injection site that is not relieved by medicines.  You develop a severe headache or a stiff neck.  You become sensitive to light.  You have any new numbness or weakness in your legs or arms.  You lose control of your bladder or bowel movements.  You have trouble breathing. Summary  An  epidural steroid injection is a shot of steroid medicine and numbing medicine that is given into the epidural space.  The shot helps relieve pain caused by an irritated or swollen nerve root.  You are more likely to benefit from this injection if your pain is strong and comes on suddenly rather than if you have had chronic pain. This information is not intended to replace advice given to you by your health care provider. Make sure you discuss any questions you have with your health care provider. Document Revised: 06/05/2019 Document Reviewed: 06/05/2019 Elsevier Patient Education  [place].

## 2020-08-22 ENCOUNTER — Ambulatory Visit (HOSPITAL_COMMUNITY)
Admission: RE | Admit: 2020-08-22 | Discharge: 2020-08-22 | Disposition: A | Discharge status: Home / Self Care | Payer: 59 | Source: Ambulatory Visit | Attending: Orthopedic Surgery | Admitting: Orthopedic Surgery

## 2020-08-22 ENCOUNTER — Other Ambulatory Visit: Payer: Self-pay

## 2020-08-22 DIAGNOSIS — M5442 Lumbago with sciatica, left side: Secondary | ICD-10-CM | POA: Insufficient documentation

## 2020-08-22 DIAGNOSIS — G8929 Other chronic pain: Secondary | ICD-10-CM | POA: Insufficient documentation

## 2020-09-05 ENCOUNTER — Encounter: Payer: Self-pay | Admitting: Orthopedic Surgery

## 2020-09-05 ENCOUNTER — Other Ambulatory Visit: Payer: Self-pay

## 2020-09-05 ENCOUNTER — Ambulatory Visit (INDEPENDENT_AMBULATORY_CARE_PROVIDER_SITE_OTHER): Payer: 59 | Admitting: Orthopedic Surgery

## 2020-09-05 VITALS — BP 149/97 | HR 55 | Ht 72.0 in | Wt 192.0 lb

## 2020-09-05 DIAGNOSIS — M4306 Spondylolysis, lumbar region: Secondary | ICD-10-CM

## 2020-09-05 DIAGNOSIS — M4317 Spondylolisthesis, lumbosacral region: Secondary | ICD-10-CM | POA: Diagnosis not present

## 2020-09-05 NOTE — Addendum Note (Signed)
Addended byCandice Camp on: 09/05/2020 09:35 AM   Modules accepted: Orders

## 2020-09-05 NOTE — Patient Instructions (Signed)
Epidural Steroid Injection  An epidural steroid injection is a shot of steroid medicine and numbing medicine that is given into the space between the spinal cord and the bones of the back (epidural space). The shot helps relieve pain caused by an irritated or swollen nerve root. The amount of pain relief you get from the injection depends on what is causing the nerve to be swollen and irritated, and how long your pain lasts. You are more likely to benefit from this injection if your pain is strong and comes on suddenly rather than if you have had long-term (chronic) pain. Tell a health care provider about:  Any allergies you have.  All medicines you are taking, including vitamins, herbs, eye drops, creams, and over-the-counter medicines.  Any problems you or family members have had with anesthetic medicines.  Any blood disorders you have.  Any surgeries you have had.  Any medical conditions you have.  Whether you are pregnant or may be pregnant. What are the risks? Generally, this is a safe procedure. However, problems may occur, including:  Headache.  Bleeding.  Infection.  Allergic reaction to medicines.  Nerve damage. What happens before the procedure? Staying hydrated Follow instructions from your health care provider about hydration, which may include:  Up to 2 hours before the procedure - you may continue to drink clear liquids, such as water, clear fruit juice, black coffee, and plain tea. Eating and drinking restrictions Follow instructions from your health care provider about eating and drinking, which may include:  8 hours before the procedure - stop eating heavy meals or foods, such as meat, fried foods, or fatty foods.  6 hours before the procedure - stop eating light meals or foods, such as toast or cereal.  6 hours before the procedure - stop drinking milk or drinks that contain milk.  2 hours before the procedure - stop drinking clear  liquids. Medicines  You may be given medicines to lower anxiety.  Ask your health care provider about: ? Changing or stopping your regular medicines. This is especially important if you are taking diabetes medicines or blood thinners. ? Taking medicines such as aspirin and ibuprofen. These medicines can thin your blood. Do not take these medicines unless your health care provider tells you to take them. ? Taking over-the-counter medicines, vitamins, herbs, and supplements.  Ask your health care provider what steps will be taken to prevent infection. General instructions  Plan to have someone take you home from the hospital or clinic.  If you will be going home right after the procedure, plan to have someone with you for 24 hours. What happens during the procedure?  An IV will be inserted into one of your veins.  You will be given one or more of the following: ? A medicine to help you relax (sedative). ? A medicine to numb the area (local anesthetic).  You will be asked to lie on your abdomen or sit.  The injection site will be cleaned.  A needle will be inserted through your skin into the epidural space. This may cause you some discomfort. An X-ray machine will be used to guide the needle as close as possible to the affected nerve.  A steroid medicine and a local anesthetic will be injected into the epidural space.  The needle and IV will be removed.  A bandage (dressing) will be put over the injection site. The procedure may vary among health care providers and hospitals. What can I expect after the  procedure? Follow these instructions at home: Injection site care  You may remove the bandage (dressing) after 24 hours.  Check your injection site every day for signs of infection. Check for: ? Redness, swelling, or pain. ? Fluid or blood. ? Warmth. ? Pus or a bad smell. Managing pain, stiffness, and swelling  For 24 hours after the procedure: ? Avoid using heat on the  injection site. ? Do not take baths, swim, or use a hot tub until your health care provider approves. Ask your health care provider if you may take a shower. You may only be allowed to take sponge baths.  If directed, put ice on the injection site. To do this: ? Put ice in a plastic bag. ? Place a towel between your skin and the bag. ? Leave the ice on for 20 minutes, 2-3 times a day.  Activity  Do not drive for 24 hours if you were given a sedative during your procedure.  Return to your normal activities as told by your health care provider. Ask your health care provider what activities are safe for you. General instructions  Your blood pressure, heart rate, breathing rate, and blood oxygen level will be monitored until you leave the hospital or clinic.  Your arm or leg may feel weak or numb for a few hours.  The injection site may feel sore.  Take over-the-counter and prescription medicines only as told by your health care provider.  Drink enough fluid to keep your urine pale yellow.  Keep all follow-up visits as told by your health care provider. This is important. Contact a health care provider if:  You have any of these signs of infection: ? Redness, swelling, or pain around your injection site. ? Fluid or blood coming from your injection site. ? Warmth coming from your injection site. ? Pus or a bad smell coming from your injection site. ? A fever.  You continue to have pain and soreness around the injection site, even after taking over-the-counter pain medicine.  You have severe, sudden, or lasting nausea or vomiting. Get help right away if:  You have severe pain at the injection site that is not relieved by medicines.  You develop a severe headache or a stiff neck.  You become sensitive to light.  You have any new numbness or weakness in your legs or arms.  You lose control of your bladder or bowel movements.  You have trouble breathing. Summary  An  epidural steroid injection is a shot of steroid medicine and numbing medicine that is given into the epidural space.  The shot helps relieve pain caused by an irritated or swollen nerve root.  You are more likely to benefit from this injection if your pain is strong and comes on suddenly rather than if you have had chronic pain. This information is not intended to replace advice given to you by your health care provider. Make sure you discuss any questions you have with your health care provider. Document Revised: 06/05/2019 Document Reviewed: 06/05/2019 Elsevier Patient Education  [place].

## 2020-09-05 NOTE — Progress Notes (Signed)
Chief Complaint  Patient presents with  . Back Pain    flares up at times, has projects to do and would like to maybe try injection     Encounter Diagnoses  Name Primary?  . Spondylolisthesis at L5-S1 level Yes  . Spondylolysis, lumbar region     Assessment and plan 59 year old male now retired he has spondylolisthesis and spondylolysis lysis with L5-S1 nerve impingement bilaterally  Recommend epidural steroid injections  History this 59 year old male has chronic back pain with occasional right and left-sided radiation into the knee and hip  Dr Ruthe Mannan reading: The MRI shows a L5-S1 bilateral pars defect with 5 mm spondylolisthesis and bilateral L5 nerve root impingement    MRI report Disc levels:   L1-2: Negative   L2-3: Slight retrolisthesis. Diffuse bulging of the disc and mild facet degeneration. Mild subarticular stenosis bilaterally   L3-4: Mild disc bulging. Negative for stenosis   L4-5: Mild disc space narrowing and mild disc bulging. Small central annular fissure and a small central disc protrusion. Mild facet degeneration. Mild subarticular stenosis bilaterally   L5-S1: Chronic bilateral pars defects of L5 with 5 mm anterolisthesis. Bilateral L5 nerve root impingement in the foramen.   IMPRESSION: Small central disc protrusion L4-5 with mild subarticular stenosis bilaterally   Bilateral pars defects of L5 with 5 mm anterolisthesis. Bilateral L5 nerve root impingement in the foramen.     Electronically Signed   By: Franchot Gallo M.D.   On: 08/23/2020 08:17

## 2020-09-09 ENCOUNTER — Other Ambulatory Visit: Payer: Self-pay | Admitting: Orthopedic Surgery

## 2020-09-09 DIAGNOSIS — M4317 Spondylolisthesis, lumbosacral region: Secondary | ICD-10-CM

## 2020-09-16 ENCOUNTER — Other Ambulatory Visit: Payer: Self-pay

## 2020-09-16 ENCOUNTER — Ambulatory Visit
Admission: RE | Admit: 2020-09-16 | Discharge: 2020-09-16 | Disposition: A | Discharge status: Home / Self Care | Payer: 59 | Source: Ambulatory Visit | Attending: Orthopedic Surgery | Admitting: Orthopedic Surgery

## 2020-09-16 DIAGNOSIS — M4317 Spondylolisthesis, lumbosacral region: Secondary | ICD-10-CM

## 2020-09-16 MED ORDER — METHYLPREDNISOLONE ACETATE 40 MG/ML INJ SUSP (RADIOLOG
120.0000 mg | Freq: Once | INTRAMUSCULAR | Status: AC
  Administered 2020-09-16: 120 mg via EPIDURAL

## 2020-09-16 MED ORDER — IOPAMIDOL (ISOVUE-M 200) INJECTION 41%
1.0000 mL | Freq: Once | INTRAMUSCULAR | Status: AC
  Administered 2020-09-16: 1 mL via EPIDURAL

## 2020-09-16 NOTE — Discharge Instructions (Signed)

## 2020-11-07 ENCOUNTER — Other Ambulatory Visit: Payer: Self-pay | Admitting: Orthopedic Surgery

## 2020-11-07 DIAGNOSIS — M4316 Spondylolisthesis, lumbar region: Secondary | ICD-10-CM

## 2020-11-14 ENCOUNTER — Inpatient Hospital Stay: Admission: RE | Admit: 2020-11-14 | Payer: 59 | Source: Ambulatory Visit

## 2020-11-20 ENCOUNTER — Ambulatory Visit
Admission: RE | Admit: 2020-11-20 | Discharge: 2020-11-20 | Disposition: A | Discharge status: Home / Self Care | Payer: 59 | Source: Ambulatory Visit | Attending: Orthopedic Surgery | Admitting: Orthopedic Surgery

## 2020-11-20 ENCOUNTER — Other Ambulatory Visit: Payer: Self-pay

## 2020-11-20 DIAGNOSIS — M4316 Spondylolisthesis, lumbar region: Secondary | ICD-10-CM

## 2020-11-20 MED ORDER — METHYLPREDNISOLONE ACETATE 40 MG/ML INJ SUSP (RADIOLOG
120.0000 mg | Freq: Once | INTRAMUSCULAR | Status: AC
  Administered 2020-11-20: 09:00:00 120 mg via EPIDURAL

## 2020-11-20 MED ORDER — IOPAMIDOL (ISOVUE-M 200) INJECTION 41%
1.0000 mL | Freq: Once | INTRAMUSCULAR | Status: AC
  Administered 2020-11-20: 09:00:00 1 mL via EPIDURAL

## 2020-11-20 NOTE — Discharge Instructions (Signed)

## 2021-01-14 ENCOUNTER — Telehealth: Payer: Self-pay | Admitting: Family Medicine

## 2021-01-14 DIAGNOSIS — Z79899 Other long term (current) drug therapy: Secondary | ICD-10-CM

## 2021-01-14 DIAGNOSIS — Z125 Encounter for screening for malignant neoplasm of prostate: Secondary | ICD-10-CM

## 2021-01-14 DIAGNOSIS — E785 Hyperlipidemia, unspecified: Secondary | ICD-10-CM

## 2021-01-14 DIAGNOSIS — Z1211 Encounter for screening for malignant neoplasm of colon: Secondary | ICD-10-CM

## 2021-01-14 NOTE — Telephone Encounter (Signed)
Last labs completed 04/29/20 PSA, HEPATIC, LIPID and BMET

## 2021-01-14 NOTE — Telephone Encounter (Signed)
Patient has physical scheduled for 6/2 and would like have his colonoscopy in May so you can go over it at his physical. He also needing labs done.

## 2021-01-14 NOTE — Telephone Encounter (Signed)
Recommend CMP, lipid, PSA please do this before his physical so that we will have results thank you  As for the colonoscopy his next colonoscopy is due in July with Dr. Fuller Plan we can send a referral they will not do the colonoscopy sooner because insurance guidelines and, follow-up for tubular adenoma by guidelines it is 5 years and in his situation 5 years does not occur until July. But we can go ahead and send a referral to Dr. Fuller Plan so that they will get him set up for July

## 2021-01-15 NOTE — Telephone Encounter (Signed)
Patient notified and verbalized understanding. 

## 2021-01-15 NOTE — Telephone Encounter (Signed)
Blood work ordered in [employer] [facility]. Referral ordered in [facility]. Left message to return call

## 2021-03-14 ENCOUNTER — Other Ambulatory Visit: Payer: Self-pay | Admitting: Orthopedic Surgery

## 2021-03-14 DIAGNOSIS — M4316 Spondylolisthesis, lumbar region: Secondary | ICD-10-CM

## 2021-03-19 ENCOUNTER — Telehealth: Payer: Self-pay | Admitting: Radiology

## 2021-03-19 NOTE — Telephone Encounter (Signed)
Patients insurance needs additional documentation to cover his injections he has upcoming at [facility], do not have anything since 9/21  He needs appointment scheduled. I called him to advise. He wants to be seen as soon as possible  Can one of you call him with next available? Everything is blocked

## 2021-03-20 ENCOUNTER — Other Ambulatory Visit: Payer: 59

## 2021-03-20 ENCOUNTER — Inpatient Hospital Stay: Admission: RE | Admit: 2021-03-20 | Payer: 59 | Source: Ambulatory Visit

## 2021-03-20 NOTE — Telephone Encounter (Signed)
I called [employer] and per Juliann Pulse this is waiting on [employer] auth.  This is his third injection, each injection has to be authorized.  I called patient and explained, no further action needed.  He understands.

## 2021-03-20 NOTE — Telephone Encounter (Signed)
Abigail Butts to work on this.

## 2021-03-26 ENCOUNTER — Ambulatory Visit
Admission: RE | Admit: 2021-03-26 | Discharge: 2021-03-26 | Disposition: A | Discharge status: Home / Self Care | Payer: 59 | Source: Ambulatory Visit | Attending: Orthopedic Surgery | Admitting: Orthopedic Surgery

## 2021-03-26 ENCOUNTER — Other Ambulatory Visit: Payer: 59

## 2021-03-26 ENCOUNTER — Other Ambulatory Visit: Payer: Self-pay

## 2021-03-26 DIAGNOSIS — M4316 Spondylolisthesis, lumbar region: Secondary | ICD-10-CM

## 2021-03-26 MED ORDER — IOPAMIDOL (ISOVUE-M 200) INJECTION 41%
1.0000 mL | Freq: Once | INTRAMUSCULAR | Status: AC
  Administered 2021-03-26: 1 mL via EPIDURAL

## 2021-03-26 MED ORDER — METHYLPREDNISOLONE ACETATE 40 MG/ML INJ SUSP (RADIOLOG
120.0000 mg | Freq: Once | INTRAMUSCULAR | Status: AC
  Administered 2021-03-26: 80 mg via EPIDURAL

## 2021-03-26 NOTE — Discharge Instructions (Signed)

## 2021-04-23 ENCOUNTER — Telehealth: Payer: Self-pay | Admitting: Family Medicine

## 2021-04-23 DIAGNOSIS — Z131 Encounter for screening for diabetes mellitus: Secondary | ICD-10-CM

## 2021-04-23 DIAGNOSIS — Z125 Encounter for screening for malignant neoplasm of prostate: Secondary | ICD-10-CM

## 2021-04-23 DIAGNOSIS — E785 Hyperlipidemia, unspecified: Secondary | ICD-10-CM

## 2021-04-23 DIAGNOSIS — Z79899 Other long term (current) drug therapy: Secondary | ICD-10-CM

## 2021-04-23 DIAGNOSIS — D696 Thrombocytopenia, unspecified: Secondary | ICD-10-CM

## 2021-04-23 NOTE — Telephone Encounter (Signed)
Patient is wanting CBC added to his labs.[facility] ask him if that needed to be added before he got his labs dine. Please advise

## 2021-04-23 NOTE — Telephone Encounter (Signed)
PSA, Lipid and CMP ordered. Please advise. Thank you

## 2021-04-23 NOTE — Telephone Encounter (Signed)
Blood work ordered in Epic. Left message to return call to notify patient. 

## 2021-04-23 NOTE — Telephone Encounter (Signed)
Pt.notified

## 2021-04-23 NOTE — Telephone Encounter (Signed)
Please add CBC to his current labs of CMP, lipid, PSA

## 2021-05-01 LAB — LIPID PANEL
Chol/HDL Ratio: 4.8 ratio (ref 0.0–5.0)
Cholesterol, Total: 200 mg/dL — ABNORMAL HIGH (ref 100–199)
HDL: 42 mg/dL (ref >39)
LDL Chol Calc (NIH): 124 mg/dL — ABNORMAL HIGH (ref 0–99)
Triglycerides: 191 mg/dL — ABNORMAL HIGH (ref 0–149)
VLDL Cholesterol Cal: 34 mg/dL (ref 5–40)

## 2021-05-01 LAB — COMPREHENSIVE METABOLIC PANEL
ALT: 19 IU/L (ref 0–44)
AST: 22 IU/L (ref 0–40)
Albumin/Globulin Ratio: 2.5 — ABNORMAL HIGH (ref 1.2–2.2)
Albumin: 4.8 g/dL (ref 3.8–4.8)
Alkaline Phosphatase: 79 IU/L (ref 44–121)
BUN/Creatinine Ratio: 14 (ref 10–24)
BUN: 16 mg/dL (ref 8–27)
Bilirubin Total: 0.6 mg/dL (ref 0.0–1.2)
CO2: 26 mmol/L (ref 20–29)
Calcium: 10.1 mg/dL (ref 8.6–10.2)
Chloride: 100 mmol/L (ref 96–106)
Creatinine, Ser: 1.13 mg/dL (ref 0.76–1.27)
Globulin, Total: 1.9 g/dL (ref 1.5–4.5)
Glucose: 95 mg/dL (ref 65–99)
Potassium: 4.5 mmol/L (ref 3.5–5.2)
Sodium: 141 mmol/L (ref 134–144)
Total Protein: 6.7 g/dL (ref 6.0–8.5)
eGFR: 74 mL/min/{1.73_m2} (ref >59)

## 2021-05-01 LAB — CBC WITH DIFFERENTIAL/PLATELET
Basophils Absolute: 0 10*3/uL (ref 0.0–0.2)
Basos: 1 %
EOS (ABSOLUTE): 0.1 10*3/uL (ref 0.0–0.4)
Eos: 2 %
Hematocrit: 46.3 % (ref 37.5–51.0)
Hemoglobin: 16.2 g/dL (ref 13.0–17.7)
Immature Grans (Abs): 0 10*3/uL (ref 0.0–0.1)
Immature Granulocytes: 0 %
Lymphocytes Absolute: 1.4 10*3/uL (ref 0.7–3.1)
Lymphs: 35 %
MCH: 31.5 pg (ref 26.6–33.0)
MCHC: 35 g/dL (ref 31.5–35.7)
MCV: 90 fL (ref 79–97)
Monocytes Absolute: 0.3 10*3/uL (ref 0.1–0.9)
Monocytes: 8 %
Neutrophils Absolute: 2.2 10*3/uL (ref 1.4–7.0)
Neutrophils: 54 %
Platelets: 147 10*3/uL — ABNORMAL LOW (ref 150–450)
RBC: 5.15 x10E6/uL (ref 4.14–5.80)
RDW: 12.4 % (ref 11.6–15.4)
WBC: 4.1 10*3/uL (ref 3.4–10.8)

## 2021-05-01 LAB — PSA: Prostate Specific Ag, Serum: 2.5 ng/mL (ref 0.0–4.0)

## 2021-05-08 ENCOUNTER — Encounter: Payer: Self-pay | Admitting: Family Medicine

## 2021-05-08 ENCOUNTER — Other Ambulatory Visit: Payer: Self-pay

## 2021-05-08 ENCOUNTER — Ambulatory Visit (INDEPENDENT_AMBULATORY_CARE_PROVIDER_SITE_OTHER): Payer: 59 | Admitting: Family Medicine

## 2021-05-08 VITALS — BP 114/74 | HR 55 | Temp 98.1°F | Ht 72.0 in | Wt 192.0 lb

## 2021-05-08 DIAGNOSIS — Z0000 Encounter for general adult medical examination without abnormal findings: Secondary | ICD-10-CM | POA: Diagnosis not present

## 2021-05-08 NOTE — Progress Notes (Signed)
   Subjective:    Patient ID: Francisco Hester, male    DOB: 1961/02/17, 60 y.o.   MRN: [id number]  HPI The patient comes in today for a wellness visit. Very nice patient Exercising on a regular basis Watching his diet Tries to be as safe as possible We did review over his lab work including elevated cholesterol.  Patient prefers not to be on statin.   A review of their health history was completed.  A review of medications was also completed.  Any needed refills; not taking any prescription meds  Eating habits: health conscious  Falls/  MVA accidents in past few months: none  Regular exercise: weight lift 3 times per week. Run/walk 5 days per week about 5 miles per day  Specialist pt sees on regular basis:   Preventative health issues were discussed.   Additional concerns: none    Review of Systems     Objective:   Physical Exam Vitals reviewed.  Constitutional:      General: He is not in acute distress. HENT:     Head: Normocephalic and atraumatic.  Eyes:     General:        Right eye: No discharge.        Left eye: No discharge.  Neck:     Trachea: No tracheal deviation.  Cardiovascular:     Rate and Rhythm: Normal rate and regular rhythm.     Heart sounds: Normal heart sounds. No murmur heard.   Pulmonary:     Effort: Pulmonary effort is normal. No respiratory distress.     Breath sounds: Normal breath sounds.  Lymphadenopathy:     Cervical: No cervical adenopathy.  Skin:    General: Skin is warm and dry.  Neurological:     Mental Status: He is alert.     Coordination: Coordination normal.  Psychiatric:        Behavior: Behavior normal.   Prostate exam mildly enlarged but soft no hard nodules are felt  Abdominal exam perfectly normal cardiac exam normal blood pressure good      Assessment & Plan:  Adult wellness-complete.wellness physical was conducted today. Importance of diet and exercise were discussed in detail.  In addition to this a  discussion regarding safety was also covered. We also reviewed over immunizations and gave recommendations regarding current immunization needed for age.  In addition to this additional areas were also touched on including: Preventative health exams needed:  Colonoscopy patient has 1 scheduled for later this summer Labs overall look good except for elevated cholesterol he is going to work very hard on diet and repeat this again in October or November We did discuss if his numbers continue to go up to consider getting a cardiac calcium score to help him decide on the urgency of starting a statin. Patient was advised yearly wellness exam

## 2021-06-18 ENCOUNTER — Other Ambulatory Visit: Payer: Self-pay

## 2021-06-18 ENCOUNTER — Ambulatory Visit (AMBULATORY_SURGERY_CENTER): Payer: 59 | Admitting: *Deleted

## 2021-06-18 VITALS — Ht 72.0 in | Wt 196.0 lb

## 2021-06-18 DIAGNOSIS — Z86010 Personal history of colonic polyps: Secondary | ICD-10-CM

## 2021-06-18 MED ORDER — PLENVU 140 G PO SOLR: 1.0000 | Freq: Once | ORAL | 0 refills | Status: AC

## 2021-06-18 NOTE — Progress Notes (Signed)

## 2021-07-02 ENCOUNTER — Encounter: Payer: 59 | Admitting: Gastroenterology

## 2021-07-30 ENCOUNTER — Encounter: Payer: Self-pay | Admitting: Gastroenterology

## 2021-07-30 ENCOUNTER — Ambulatory Visit (AMBULATORY_SURGERY_CENTER): Payer: 59 | Admitting: Gastroenterology

## 2021-07-30 ENCOUNTER — Other Ambulatory Visit: Payer: Self-pay

## 2021-07-30 VITALS — BP 122/66 | HR 47 | Temp 97.8°F | Resp 11 | Ht 72.0 in | Wt 196.0 lb

## 2021-07-30 DIAGNOSIS — K635 Polyp of colon: Secondary | ICD-10-CM

## 2021-07-30 DIAGNOSIS — D123 Benign neoplasm of transverse colon: Secondary | ICD-10-CM

## 2021-07-30 DIAGNOSIS — Z86010 Personal history of colonic polyps: Secondary | ICD-10-CM

## 2021-07-30 DIAGNOSIS — D122 Benign neoplasm of ascending colon: Secondary | ICD-10-CM

## 2021-07-30 DIAGNOSIS — D125 Benign neoplasm of sigmoid colon: Secondary | ICD-10-CM

## 2021-07-30 MED ORDER — SODIUM CHLORIDE 0.9 % IV SOLN: 500.0000 mL | Freq: Once | INTRAVENOUS | Status: DC

## 2021-07-30 NOTE — Op Note (Signed)
[facility] Patient Name: Francisco Hester Procedure Date: 07/30/2021 8:34 AM MRN: [id number] Endoscopist: Ladene Artist , MD Age: 60 Referring MD:  Date of Birth: 31-Mar-1961 Gender: Male Account #: [id number] Procedure:                Colonoscopy Indications:              High risk colon cancer surveillance: Personal                            history of traditional serrated adenoma of the colon Medicines:                Monitored Anesthesia Care Procedure:                Pre-Anesthesia Assessment:                           - Prior to the procedure, a History and Physical                            was performed, and patient medications and                            allergies were reviewed. The patient's tolerance of                            previous anesthesia was also reviewed. The risks                            and benefits of the procedure and the sedation                            options and risks were discussed with the patient.                            All questions were answered, and informed consent                            was obtained. Prior Anticoagulants: The patient has                            taken no previous anticoagulant or antiplatelet                            agents. ASA Grade Assessment: I - A normal, healthy                            patient. After reviewing the risks and benefits,                            the patient was deemed in satisfactory condition to                            undergo the procedure.  After obtaining informed consent, the colonoscope                            was passed under direct vision. Throughout the                            procedure, the patient's blood pressure, pulse, and                            oxygen saturations were monitored continuously. The                            Olympus CF-HQ190L [id number]) Colonoscope was                            introduced through the  anus and advanced to the the                            cecum, identified by appendiceal orifice and                            ileocecal valve. The ileocecal valve, appendiceal                            orifice, and rectum were photographed. The quality                            of the bowel preparation was good. The colonoscopy                            was performed without difficulty. The patient                            tolerated the procedure well. Scope In: [id number] AM Scope Out: 9:02:56 AM Scope Withdrawal Time: 0 hours 14 minutes 35 seconds  Total Procedure Duration: 0 hours 16 minutes 48 seconds  Findings:                 The perianal and digital rectal examinations were                            normal.                           Four sessile polyps were found in the sigmoid                            colon, transverse colon and ascending colon (2).                            The polyps were 5 to 7 mm in size. These polyps                            were removed with a cold snare. Resection and  retrieval were complete.                           Internal hemorrhoids were found during                            retroflexion. The hemorrhoids were small and Grade                            I (internal hemorrhoids that do not prolapse).                           The exam was otherwise without abnormality on                            direct and retroflexion views. Complications:            No immediate complications. Estimated blood loss:                            None. Estimated Blood Loss:     Estimated blood loss: none. Impression:               - Four 5 to 7 mm polyps in the sigmoid colon, in                            the transverse colon and in the ascending colon,                            removed with a cold snare. Resected and retrieved.                           - Internal hemorrhoids.                           - The examination was  otherwise normal on direct                            and retroflexion views. Recommendation:           - Repeat colonoscopy after studies are complete for                            surveillance based on pathology results.                           - Patient has a contact number available for                            emergencies. The signs and symptoms of potential                            delayed complications were discussed with the                            patient. Return to normal activities tomorrow.  Written discharge instructions were provided to the                            patient.                           - Resume previous diet.                           - Continue present medications.                           - Await pathology results. Ladene Artist, MD 07/30/2021 9:06:06 AM This report has been signed electronically.

## 2021-07-30 NOTE — Progress Notes (Signed)
Called to room to assist during endoscopic procedure.  Patient ID and intended procedure confirmed with present staff. Received instructions for my participation in the procedure from the performing physician.  

## 2021-07-30 NOTE — Patient Instructions (Signed)
Information on polyps and hemorrhoids given to you today.  Await pathology results.  Resume previous diet and medications.  YOU HAD AN ENDOSCOPIC PROCEDURE TODAY AT [facility]:   Refer to the procedure report that was given to you for any specific questions about what was found during the examination.  If the procedure report does not answer your questions, please call your gastroenterologist to clarify.  If you requested that your care partner not be given the details of your procedure findings, then the procedure report has been included in a sealed envelope for you to review at your convenience later.  YOU SHOULD EXPECT: Some feelings of bloating in the abdomen. Passage of more gas than usual.  Walking can help get rid of the air that was put into your GI tract during the procedure and reduce the bloating. If you had a lower endoscopy (such as a colonoscopy or flexible sigmoidoscopy) you may notice spotting of blood in your stool or on the toilet paper. If you underwent a bowel prep for your procedure, you may not have a normal bowel movement for a few days.  Please Note:  You might notice some irritation and congestion in your nose or some drainage.  This is from the oxygen used during your procedure.  There is no need for concern and it should clear up in a day or so.  SYMPTOMS TO REPORT IMMEDIATELY:  Following lower endoscopy (colonoscopy or flexible sigmoidoscopy):  Excessive amounts of blood in the stool  Significant tenderness or worsening of abdominal pains  Swelling of the abdomen that is new, acute  Fever of 100F or higher   For urgent or emergent issues, a gastroenterologist can be reached at any hour by calling [phone]. Do not use MyChart messaging for urgent concerns.    DIET:  We do recommend a small meal at first, but then you may proceed to your regular diet.  Drink plenty of fluids but you should avoid alcoholic beverages for 24  hours.  ACTIVITY:  You should plan to take it easy for the rest of today and you should NOT DRIVE or use heavy machinery until tomorrow (because of the sedation medicines used during the test).    FOLLOW UP: Our staff will call the number listed on your records 48-72 hours following your procedure to check on you and address any questions or concerns that you may have regarding the information given to you following your procedure. If we do not reach you, we will leave a message.  We will attempt to reach you two times.  During this call, we will ask if you have developed any symptoms of COVID 19. If you develop any symptoms (ie: fever, flu-like symptoms, shortness of breath, cough etc.) before then, please call [phone].  If you test positive for Covid 19 in the 2 weeks post procedure, please call and report this information to [place].    If any biopsies were taken you will be contacted by phone or by letter within the next 1-3 weeks.  Please call us at [phone] if you have not heard about the biopsies in 3 weeks.    SIGNATURES/CONFIDENTIALITY: You and/or your care partner have signed paperwork which will be entered into your electronic medical record.  These signatures attest to the fact that that the information above on your After Visit Summary has been reviewed and is understood.  Full responsibility of the confidentiality of this discharge information lies with you and/or your  care-partner.  

## 2021-07-30 NOTE — Progress Notes (Signed)
Report given to PACU, vss 

## 2021-07-30 NOTE — Progress Notes (Signed)
VS  DT ? ?Pt's states no medical or surgical changes since previsit or office visit. ? ?

## 2021-07-30 NOTE — Progress Notes (Signed)
History & Physical  Primary Care Physician:  Kathyrn Drown, MD Primary Gastroenterologist: Jerilynn Mages. Fuller Plan, MD  CHIEF COMPLAINT:  Personal history of colon polyps   HPI: Francisco Hester is a 60 y.o. 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 Last colonoscopy in 2017. No active colorectal symptoms.   Past Medical History:  Diagnosis Date   Hemorrhoids    Low back pain     Past Surgical History:  Procedure Laterality Date   COLONOSCOPY  2012   POLYPECTOMY     WISDOM TOOTH EXTRACTION      Prior to Admission medications   Medication Sig Start Date End Date Taking? Authorizing Provider  Ascorbic Acid (VITAMIN C PO) Take by mouth.   Yes [provider]  Biotin 1 MG CAPS Take 1 tablet by mouth daily.    Yes [provider]  Cholecalciferol (VITAMIN D PO) Take by mouth.   Yes [provider]  Omega-3 Fatty Acids (FISH OIL PO) Take by mouth.   Yes [provider]  VITAMIN K PO Take by mouth.   Yes [provider]    Current Outpatient Medications  Medication Sig Dispense Refill   Ascorbic Acid (VITAMIN C PO) Take by mouth.     Biotin 1 MG CAPS Take 1 tablet by mouth daily.      Cholecalciferol (VITAMIN D PO) Take by mouth.     Omega-3 Fatty Acids (FISH OIL PO) Take by mouth.     VITAMIN K PO Take by mouth.     Current Facility-Administered Medications  Medication Dose Route Frequency Provider Last Rate Last Admin   0.9 %  sodium chloride infusion  500 mL Intravenous Once Ladene Artist, MD        Allergies as of 07/30/2021   (No Known Allergies)    Family History  Problem Relation Age of Onset   Colon polyps Mother    COPD Mother    Dementia Father    Hypertension Father    COPD Brother    Stroke Maternal Grandmother    Colon cancer Neg Hx    Esophageal cancer Neg Hx    Rectal cancer Neg Hx    Stomach cancer Neg Hx     Social History    Socioeconomic History   Marital status: Married    Spouse name: Not on file   Number of children: Not on file   Years of education: Not on file   Highest education level: Not on file  Occupational History   Not on file  Tobacco Use   Smoking status: Never   Smokeless tobacco: Never  Vaping Use   Vaping Use: Never used  Substance and Sexual Activity   Alcohol use: No    Alcohol/week: 0.0 standard drinks   Drug use: No   Sexual activity: Yes  Other Topics Concern   Not on file  Social History Narrative   Not on file   Social Determinants of Health   Financial Resource Strain: Not on file  Food Insecurity: Not on file  Transportation Needs: Not on file  Physical Activity: Not on file  Stress: Not on file  Social Connections: Not on file  Intimate Partner Violence: Not on file    Review of Systems:  All systems reviewed an negative except where noted in HPI.  Gen: Denies any fever, chills, sweats, anorexia, fatigue, weakness, malaise, weight loss, and sleep disorder CV: Denies  chest pain, angina, palpitations, syncope, orthopnea, PND, peripheral edema, and claudication. Resp: Denies dyspnea at rest, dyspnea with exercise, cough, sputum, wheezing, coughing up blood, and pleurisy. GI: Denies vomiting blood, jaundice, and fecal incontinence.   Denies dysphagia or odynophagia. GU : Denies urinary burning, blood in urine, urinary frequency, urinary hesitancy, nocturnal urination, and urinary incontinence. MS: Denies joint pain, limitation of movement, and swelling, stiffness, low back pain, extremity pain. Denies muscle weakness, cramps, atrophy.  Derm: Denies rash, itching, dry skin, hives, moles, warts, or unhealing ulcers.  Psych: Denies depression, anxiety, memory loss, suicidal ideation, hallucinations, paranoia, and confusion. Heme: Denies bruising, bleeding, and enlarged lymph nodes. Neuro:  Denies any headaches, dizziness, paresthesias. Endo:  Denies any problems  with DM, thyroid, adrenal function.  Physical Exam: General:  Alert, well-developed, in NAD Head:  Normocephalic and atraumatic. Eyes:  Sclera clear, no icterus.   Conjunctiva pink. Ears:  Normal auditory acuity. Mouth:  No deformity or lesions.  Neck:  Supple; no masses . Lungs:  Clear throughout to auscultation.   No wheezes, crackles, or rhonchi. No acute distress. Heart:  Regular rate and rhythm; no murmurs. Abdomen:  Soft, nondistended, nontender. No masses, hepatomegaly. No obvious masses.  Normal bowel .    Rectal:  Deferred   Msk:  Symmetrical without gross deformities.. Pulses:  Normal pulses noted. Extremities:  Without edema. Neurologic:  Alert and  oriented x4;  grossly normal neurologically. Skin:  Intact without significant lesions or rashes. Cervical Nodes:  No significant cervical adenopathy. Psych:  Alert and cooperative. Normal mood and affect.   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  Last colonoscopy in 2017.  For surveillance colonoscopy. The risks (including bleeding, perforation, infection, missed lesions, medication reactions and possible hospitalization or surgery if complications occur), benefits, and alternatives to colonoscopy with possible biopsy and possible polypectomy were discussed with the patient and they consent to proceed.     This patient is appropriate for endoscopic procedures in the ambulatory setting.    Pricilla Riffle. Fuller Plan MD 07/30/2021, 8:35 AM

## 2021-08-01 ENCOUNTER — Telehealth: Payer: Self-pay

## 2021-08-01 ENCOUNTER — Telehealth: Payer: Self-pay | Admitting: *Deleted

## 2021-08-01 NOTE — Telephone Encounter (Signed)
  Follow up Call-  Call back number 07/30/2021  Post procedure Call Back phone  # [phone]  Permission to leave phone message Yes  Some recent data might be hidden   LMOM to call back with any questions or concerns.  Also, call back if patient has developed fever, respiratory issues or been dx with COVID or had any family members or close contacts diagnosed since her procedure.

## 2021-08-01 NOTE — Telephone Encounter (Signed)
Left message on follow up call. 

## 2021-08-12 ENCOUNTER — Telehealth: Payer: Self-pay | Admitting: Gastroenterology

## 2021-08-12 NOTE — Telephone Encounter (Signed)
Inbound call from patient requesting pathology results please.

## 2021-08-12 NOTE — Telephone Encounter (Signed)
Patient notified that no acute problems, Dr. Fuller Plan will send him him a letter when he has reviewed

## 2021-08-15 ENCOUNTER — Encounter: Payer: Self-pay | Admitting: Gastroenterology

## 2021-09-15 ENCOUNTER — Ambulatory Visit: Payer: 59 | Admitting: Orthopedic Surgery

## 2021-09-22 ENCOUNTER — Encounter: Payer: Self-pay | Admitting: Orthopedic Surgery

## 2021-09-22 ENCOUNTER — Ambulatory Visit (INDEPENDENT_AMBULATORY_CARE_PROVIDER_SITE_OTHER): Payer: 59 | Admitting: Orthopedic Surgery

## 2021-09-22 ENCOUNTER — Other Ambulatory Visit: Payer: Self-pay

## 2021-09-22 VITALS — BP 124/85 | HR 65 | Ht 72.0 in | Wt 198.8 lb

## 2021-09-22 DIAGNOSIS — M4317 Spondylolisthesis, lumbosacral region: Secondary | ICD-10-CM

## 2021-09-22 NOTE — Progress Notes (Addendum)
Chief Complaint  Patient presents with   Back Pain    Follow up// discuss another ESI    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  Addendum: C/O: Pain left lower back to dorsal aspect of left foot L5  No sensory loss but pain travels in this dermatome.   L4-5: Mild disc space narrowing and mild disc bulging. Small central annular fissure and a small central disc protrusion. Mild facet degeneration. Mild subarticular stenosis bilaterally   L5-S1: Chronic bilateral pars defects of L5 with 5 mm anterolisthesis. Bilateral L5 nerve root impingement in the foramen.   IMPRESSION: Small central disc protrusion L4-5 with mild subarticular stenosis bilaterally   Bilateral pars defects of L5 with 5 mm anterolisthesis. Bilateral L5 nerve root impingement in the foramen.   Inject L5 nerve root by esi technique Electronically Signed   By: Franchot Gallo M.D.   On: 08/23/2020 08:17

## 2021-09-22 NOTE — Addendum Note (Signed)
Addended byCandice Camp on: 09/22/2021 04:29 PM   Modules accepted: Orders

## 2021-09-22 NOTE — Patient Instructions (Signed)
[facility] will call you for injections [phone] is the number

## 2021-09-23 ENCOUNTER — Other Ambulatory Visit: Payer: Self-pay | Admitting: Orthopedic Surgery

## 2021-09-23 DIAGNOSIS — M4317 Spondylolisthesis, lumbosacral region: Secondary | ICD-10-CM

## 2021-10-02 ENCOUNTER — Telehealth: Payer: Self-pay | Admitting: Orthopedic Surgery

## 2021-10-02 NOTE — Telephone Encounter (Signed)
Patient called to ask if we can assist regarding his epidural steroid injections - said that he was contacted by his insurance relaying that it was denied. Said he recalls this occurring the last time - please advise. Gave home ph# [phone]

## 2021-10-02 NOTE — Telephone Encounter (Signed)
Advised him faxed additional information for the appeal today

## 2021-10-02 NOTE — Telephone Encounter (Signed)
I have gotten a denial about the lumbar ESIs They need more documentation about the pattern related to the level of the spine to be treated  Can you addend the note from this week or do you need to see him again?  I put the denial on your keyboard if you want to review.

## 2021-10-08 ENCOUNTER — Telehealth: Payer: Self-pay | Admitting: Radiology

## 2021-10-08 NOTE — Telephone Encounter (Signed)
Faxed appeal on Monday  Called today to check on it, but it was not received Advised case number is [id number] and to fax to [phone] I have faxed and was told if this doesn't work we can still do peer to peer review As long as it is within 14 d.

## 2021-10-09 NOTE — Telephone Encounter (Signed)
Approved, faxed to [facility]  Left message for Francisco Hester to advise [phone] Patient advised also

## 2021-10-09 NOTE — Telephone Encounter (Deleted)
Francisco Hester, RT [facility]  [employer]) left message for a Dr. Aline Brochure patient. No identifying information on who the patient was,  just that they needed more information. They did leave call back number: [phone], CASE # [id number] and fax number [phone].

## 2021-10-15 ENCOUNTER — Ambulatory Visit
Admission: RE | Admit: 2021-10-15 | Discharge: 2021-10-15 | Disposition: A | Discharge status: Home / Self Care | Payer: 59 | Source: Ambulatory Visit | Attending: Orthopedic Surgery | Admitting: Orthopedic Surgery

## 2021-10-15 ENCOUNTER — Other Ambulatory Visit: Payer: Self-pay

## 2021-10-15 DIAGNOSIS — M4317 Spondylolisthesis, lumbosacral region: Secondary | ICD-10-CM

## 2021-10-15 MED ORDER — METHYLPREDNISOLONE ACETATE 40 MG/ML INJ SUSP (RADIOLOG
80.0000 mg | Freq: Once | INTRAMUSCULAR | Status: AC
  Administered 2021-10-15: 80 mg via EPIDURAL

## 2021-10-15 MED ORDER — IOPAMIDOL (ISOVUE-M 200) INJECTION 41%
1.0000 mL | Freq: Once | INTRAMUSCULAR | Status: AC
  Administered 2021-10-15: 1 mL via EPIDURAL

## 2021-10-15 NOTE — Discharge Instructions (Signed)

## 2022-04-27 ENCOUNTER — Telehealth: Payer: Self-pay | Admitting: *Deleted

## 2022-04-27 DIAGNOSIS — E785 Hyperlipidemia, unspecified: Secondary | ICD-10-CM

## 2022-04-27 DIAGNOSIS — Z125 Encounter for screening for malignant neoplasm of prostate: Secondary | ICD-10-CM

## 2022-04-27 DIAGNOSIS — Z79899 Other long term (current) drug therapy: Secondary | ICD-10-CM

## 2022-04-27 DIAGNOSIS — Z131 Encounter for screening for diabetes mellitus: Secondary | ICD-10-CM

## 2022-04-27 NOTE — Telephone Encounter (Signed)
Patient has a physical scheduled for 05/12/22 and needs to get labs done. Labs last were done on 04/30/21 CBC,CMET,Lipid and PSA.  Please advise. Thank you

## 2022-04-28 NOTE — Telephone Encounter (Addendum)
Blood work ordered in Epic. Left message to return call 

## 2022-04-28 NOTE — Telephone Encounter (Signed)
Lipid, liver, metabolic 7, PSA, CBC

## 2022-04-28 NOTE — Telephone Encounter (Signed)
Patient notified

## 2022-05-06 LAB — LIPID PANEL
Chol/HDL Ratio: 4.3 ratio (ref 0.0–5.0)
Cholesterol, Total: 181 mg/dL (ref 100–199)
HDL: 42 mg/dL (ref >39)
LDL Chol Calc (NIH): 115 mg/dL — ABNORMAL HIGH (ref 0–99)
Triglycerides: 135 mg/dL (ref 0–149)
VLDL Cholesterol Cal: 24 mg/dL (ref 5–40)

## 2022-05-06 LAB — BASIC METABOLIC PANEL
BUN/Creatinine Ratio: 13 (ref 10–24)
BUN: 15 mg/dL (ref 8–27)
CO2: 27 mmol/L (ref 20–29)
Calcium: 9.9 mg/dL (ref 8.6–10.2)
Chloride: 101 mmol/L (ref 96–106)
Creatinine, Ser: 1.14 mg/dL (ref 0.76–1.27)
Glucose: 96 mg/dL (ref 70–99)
Potassium: 4.4 mmol/L (ref 3.5–5.2)
Sodium: 141 mmol/L (ref 134–144)
eGFR: 73 mL/min/{1.73_m2} (ref >59)

## 2022-05-06 LAB — CBC WITH DIFFERENTIAL/PLATELET
Basophils Absolute: 0 10*3/uL (ref 0.0–0.2)
Basos: 1 %
EOS (ABSOLUTE): 0 10*3/uL (ref 0.0–0.4)
Eos: 1 %
Hematocrit: 48.9 % (ref 37.5–51.0)
Hemoglobin: 16.6 g/dL (ref 13.0–17.7)
Immature Grans (Abs): 0 10*3/uL (ref 0.0–0.1)
Immature Granulocytes: 0 %
Lymphocytes Absolute: 1.2 10*3/uL (ref 0.7–3.1)
Lymphs: 35 %
MCH: 31 pg (ref 26.6–33.0)
MCHC: 33.9 g/dL (ref 31.5–35.7)
MCV: 91 fL (ref 79–97)
Monocytes Absolute: 0.3 10*3/uL (ref 0.1–0.9)
Monocytes: 7 %
Neutrophils Absolute: 1.9 10*3/uL (ref 1.4–7.0)
Neutrophils: 56 %
Platelets: 147 10*3/uL — ABNORMAL LOW (ref 150–450)
RBC: 5.36 x10E6/uL (ref 4.14–5.80)
RDW: 12.8 % (ref 11.6–15.4)
WBC: 3.5 10*3/uL (ref 3.4–10.8)

## 2022-05-06 LAB — HEPATIC FUNCTION PANEL
ALT: 23 IU/L (ref 0–44)
AST: 23 IU/L (ref 0–40)
Albumin: 4.7 g/dL (ref 3.8–4.8)
Alkaline Phosphatase: 73 IU/L (ref 44–121)
Bilirubin Total: 0.6 mg/dL (ref 0.0–1.2)
Bilirubin, Direct: 0.13 mg/dL (ref 0.00–0.40)
Total Protein: 6.5 g/dL (ref 6.0–8.5)

## 2022-05-06 LAB — PSA: Prostate Specific Ag, Serum: 2.7 ng/mL (ref 0.0–4.0)

## 2022-05-12 ENCOUNTER — Encounter: Payer: Self-pay | Admitting: Family Medicine

## 2022-05-12 ENCOUNTER — Ambulatory Visit (INDEPENDENT_AMBULATORY_CARE_PROVIDER_SITE_OTHER): Payer: 59 | Admitting: Family Medicine

## 2022-05-12 VITALS — BP 138/84 | HR 56 | Temp 97.3°F | Ht 70.5 in | Wt 190.2 lb

## 2022-05-12 DIAGNOSIS — Z0000 Encounter for general adult medical examination without abnormal findings: Secondary | ICD-10-CM

## 2022-05-12 NOTE — Progress Notes (Signed)
   Subjective:    Patient ID: Francisco Hester, male    DOB: 08-18-1961, 61 y.o.   MRN: [id number]  HPI The patient comes in today for a wellness visit.    A review of their health history was completed.  A review of medications was also completed.  Any needed refills; not on any chronic meds at this time  Eating habits: good  Falls/  MVA accidents in past few months: none  Regular exercise: run/walk 5 days per week (5-6 miles/day) lift weight 3 days per week.   Specialist pt sees on regular basis: none on a regular basis   Preventative health issues were discussed.  Denies chest pain/doe Denies rectal bleeding Additional concerns:    Review of Systems     Objective:   Physical Exam  General-in no acute distress Eyes-no discharge Lungs-respiratory rate normal, CTA CV-no murmurs,RRR Extremities skin warm dry no edema Neuro grossly normal Behavior normal, alert  The 10-year ASCVD risk score (Arnett DK, et al., 2019) is: 11.9%   Values used to calculate the score:     Age: 61 years     Sex: Male     Is Non-Hispanic African American: No     Diabetic: No     Tobacco smoker: No     Systolic Blood Pressure: 829 mmHg     Is BP treated: No     HDL Cholesterol: 42 mg/dL     Total Cholesterol: 181 mg/dL      Assessment & Plan:  Wellness- Adult wellness-complete.wellness physical was conducted today. Importance of diet and exercise were discussed in detail.  Importance of stress reduction and healthy living were discussed.  In addition to this a discussion regarding safety was also covered.  We also reviewed over immunizations and gave recommendations regarding current immunization needed for age.   In addition to this additional areas were also touched on including: Preventative health exams needed:  Colonoscopy up-to-date  Patient was advised yearly wellness exam Hyperlipidemia-healthy diet recommended Patient defers on statins Patient will consider  coronary calcium score Follow-up in 1 years time follow-up sooner problems If you want to do lab work in the fall to notify us

## 2022-05-12 NOTE — Patient Instructions (Addendum)
Shingrix and shingles prevention: know the facts!   Shingrix is a very effective vaccine to prevent shingles.   Shingles is a reactivation of chickenpox -more than 99% of Americans born before 1980 have had chickenpox even if they do not remember it. One in every 10 people who get shingles have severe long-lasting nerve pain as a result.   33 out of a 100 older adults will get shingles if they are unvaccinated.     This vaccine is very important for your health This vaccine is indicated for anyone 50 years or older. You can get this vaccine even if you have already had shingles because you can get the disease more than once in a lifetime.  Your risk for shingles and its complications increases with age.  This vaccine has 2 doses.  The second dose would be 2 to 6 months after the first dose.  If you had Zostavax vaccine in the past you should still get Shingrix. ( Zostavax is only 70% effective and it loses significant strength over a few years .)  This vaccine is given through the pharmacy.  The cost of the vaccine is through your insurance. The pharmacy can inform you of the total costs.  Common side effects including soreness in the arm, some redness and swelling, also some feel fatigue muscle soreness headache low-grade fever.  Side effects typically go away within 2 to 3 days. Remember-the pain from shingles can last a lifetime but these side effects of the vaccine will only last a few days at most. It is very important to get both doses in order to protect yourself fully.   Please get this vaccine at your earliest convenience at your trusted pharmacy.     Shingles vaccine recommended

## 2022-07-08 ENCOUNTER — Ambulatory Visit (INDEPENDENT_AMBULATORY_CARE_PROVIDER_SITE_OTHER): Payer: 59 | Admitting: Orthopedic Surgery

## 2022-07-08 ENCOUNTER — Encounter: Payer: Self-pay | Admitting: Orthopedic Surgery

## 2022-07-08 VITALS — BP 138/78 | HR 60 | Ht 70.5 in | Wt 190.0 lb

## 2022-07-08 DIAGNOSIS — M65311 Trigger thumb, right thumb: Secondary | ICD-10-CM | POA: Diagnosis not present

## 2022-07-08 MED ORDER — METHYLPREDNISOLONE ACETATE 40 MG/ML IJ SUSP
40.0000 mg | Freq: Once | INTRAMUSCULAR | Status: AC
Start: 2022-07-08 — End: 2022-07-08
  Administered 2022-07-08: 40 mg via INTRAMUSCULAR

## 2022-07-08 NOTE — Progress Notes (Signed)
Chief Complaint  Patient presents with   Hand Problem    Thumb catches and locks left/ also states has knots palm of hands not too bothersome but wants to know what they are    61 year old male new complaint of pain and catching right thumb he also has some palpable cords in his right and left palm and says that his father had the same thing when he was older  The patient complains of catching locking right thumb with pain over the A1 pulley worse after sleeping  Exam right thumb focused  Normal color capillary refill clicking and popping at the IP joint tenderness over the A1 pulley right thumb  He also has Dupuytren's contractures small and ring finger both hands.  The small fingers have contractures at the PIP joint but he says this may have been from injuries that he had in high school  Patient is amenable to injection for right trigger thumb  Right Trigger thumb injection Medication  1 mL of 40 mg Depo-Medrol  2 mL of 1% lidocaine plain  Ethyl chloride for anesthesia  Verbal consent was obtained timeout was taken to confirm the injection site as right thumb  Alcohol was used to prepare the skin along with ethyl chloride and then the injection was made at the A1 pulley there were no complications   Follow-up as needed

## 2022-07-21 ENCOUNTER — Telehealth: Payer: Self-pay | Admitting: Family Medicine

## 2022-07-21 NOTE — Telephone Encounter (Signed)
Front Patient is having elevated blood pressure I spoke with him by phone on Tuesday evening Instructed him to come to the office at 3 PM for a 310 appointment with me Please put him on the schedule thank you  He is aware thank you

## 2022-07-22 ENCOUNTER — Ambulatory Visit (INDEPENDENT_AMBULATORY_CARE_PROVIDER_SITE_OTHER): Payer: 59 | Admitting: Family Medicine

## 2022-07-22 ENCOUNTER — Encounter: Payer: Self-pay | Admitting: Family Medicine

## 2022-07-22 VITALS — BP 122/78 | Wt 193.6 lb

## 2022-07-22 DIAGNOSIS — R030 Elevated blood-pressure reading, without diagnosis of hypertension: Secondary | ICD-10-CM | POA: Diagnosis not present

## 2022-07-22 NOTE — Progress Notes (Signed)
   Subjective:    Patient ID: Francisco Hester, male    DOB: 18-Oct-1961, 61 y.o.   MRN: [id number]  HPI Pt arrives to office with concerns of high blood pressure. Pt states he got to thinking about Dr.Fagan and his stroke. Pt states he is not sure if this was stress. Pt states that he checked his blood pressure today and it was 127/85 and 129/87. No unusual symptoms.   We did discuss the best approach for this would be healthy diet regular activity keep blood pressure under good control and I recommend that he recheck his cholesterol profile later this year Review of Systems     Objective:   Physical Exam General-in no acute distress Eyes-no discharge Lungs-respiratory rate normal, CTA CV-no murmurs,RRR Extremities skin warm dry no edema Neuro grossly normal Behavior normal, alert        Assessment & Plan:  Blood pressure readings acceptable when he first came in but once we started talking about things and he was checking his blood pressure it became stressful for him and his blood pressure did go up  He does have a helmet whitecoat hypertension related to stress blood pressure readings at home is the best approach send [place] readings on a regular basis consider repeating lipid profile later this year as discussed above and follow-up for regular checkups

## 2022-07-22 NOTE — Patient Instructions (Signed)

## 2022-09-19 ENCOUNTER — Ambulatory Visit
Admission: EM | Admit: 2022-09-19 | Discharge: 2022-09-19 | Disposition: A | Discharge status: Home / Self Care | Payer: 59 | Attending: Urgent Care | Admitting: Urgent Care

## 2022-09-19 ENCOUNTER — Encounter: Payer: Self-pay | Admitting: Emergency Medicine

## 2022-09-19 DIAGNOSIS — J018 Other acute sinusitis: Secondary | ICD-10-CM

## 2022-09-19 MED ORDER — CETIRIZINE HCL 10 MG PO TABS: 10.0000 mg | ORAL_TABLET | Freq: Every day | ORAL | 0 refills | Status: AC

## 2022-09-19 MED ORDER — AMOXICILLIN-POT CLAVULANATE 875-125 MG PO TABS: 1.0000 | ORAL_TABLET | Freq: Two times a day (BID) | ORAL | 0 refills | Status: DC

## 2022-09-19 NOTE — ED Provider Notes (Signed)
[facility]  Note:  This document was prepared using Systems analyst and may include unintentional dictation errors.  MRN: [id number] DOB: 1961/10/14  Subjective:   Francisco Hester is a 61 y.o. male presenting for 3 to 4-week history of persistent sinus congestion, sinus pressure, drainage.  Has had an occasional cough.  No chest pain, shortness of breath or wheezing.  No history of allergies.  Patient is not a smoker.  He does not like taking decongestants or prednisone because they make him really jittery and anxious.  No current facility-administered medications for this encounter.  Current Outpatient Medications:    Ascorbic Acid (VITAMIN C PO), Take by mouth., Disp: , Rfl:    Biotin 1 MG CAPS, Take 1 tablet by mouth daily. , Disp: , Rfl:    Cholecalciferol (VITAMIN D PO), Take by mouth., Disp: , Rfl:    Omega-3 Fatty Acids (FISH OIL PO), Take by mouth., Disp: , Rfl:    VITAMIN K PO, Take by mouth., Disp: , Rfl:    No Known Allergies  Past Medical History:  Diagnosis Date   Hemorrhoids    Low back pain      Past Surgical History:  Procedure Laterality Date   COLONOSCOPY  2012   POLYPECTOMY     WISDOM TOOTH EXTRACTION      Family History  Problem Relation Age of Onset   Colon polyps Mother    COPD Mother    Dementia Father    Hypertension Father    COPD Brother    Stroke Maternal Grandmother    Colon cancer Neg Hx    Esophageal cancer Neg Hx    Rectal cancer Neg Hx    Stomach cancer Neg Hx     Social History   Tobacco Use   Smoking status: Never   Smokeless tobacco: Never  Vaping Use   Vaping Use: Never used  Substance Use Topics   Alcohol use: No    Alcohol/week: 0.0 standard drinks of alcohol   Drug use: No    ROS   Objective:   Vitals: BP (!) 160/80 (BP Location: Right Arm)   Pulse (!) 58   Temp (!) 97.5 F (36.4 C) (Oral)   Resp 16   SpO2 99%   BP Readings from Last 3 Encounters:  09/19/22  (!) 160/80  07/22/22 122/78  07/08/22 138/78   Physical Exam Constitutional:      General: He is not in acute distress.    Appearance: Normal appearance. He is well-developed and normal weight. He is not ill-appearing, toxic-appearing or diaphoretic.  HENT:     Head: Normocephalic and atraumatic.     Right Ear: Tympanic membrane, ear canal and external ear normal. There is no impacted cerumen.     Left Ear: Tympanic membrane, ear canal and external ear normal. There is no impacted cerumen.     Nose: Congestion present. No rhinorrhea.     Mouth/Throat:     Mouth: Mucous membranes are moist.     Pharynx: No oropharyngeal exudate or posterior oropharyngeal erythema.     Comments: Post-nasal drainage overlying pharynx. Eyes:     General: No scleral icterus.       Right eye: No discharge.        Left eye: No discharge.     Extraocular Movements: Extraocular movements intact.     Conjunctiva/sclera: Conjunctivae normal.  Cardiovascular:     Rate and Rhythm: Normal rate and regular rhythm.  Heart sounds: Normal heart sounds. No murmur heard.    No friction rub. No gallop.  Pulmonary:     Effort: Pulmonary effort is normal. No respiratory distress.     Breath sounds: Normal breath sounds. No stridor. No wheezing, rhonchi or rales.  Musculoskeletal:     Cervical back: Normal range of motion and neck supple. No rigidity. No muscular tenderness.  Neurological:     General: No focal deficit present.     Mental Status: He is alert and oriented to person, place, and time.  Psychiatric:        Mood and Affect: Mood normal.        Behavior: Behavior normal.        Thought Content: Thought content normal.     Assessment and Plan :   PDMP not reviewed this encounter.  1. Acute non-recurrent sinusitis of other sinus     Will start empiric treatment for sinusitis with Augmentin.  Recommended supportive care otherwise including the use of oral antihistamine.  Recommended he consider  prednisone should his symptoms continue going into the next week.  Recheck for this in clinic.  Counseled patient on potential for adverse effects with medications prescribed/recommended today, ER and return-to-clinic precautions discussed, patient verbalized understanding.    Jaynee Eagles, PA-C 09/19/22 1034

## 2022-09-19 NOTE — ED Triage Notes (Signed)
Nasal congestion and facial pressure x 2 weeks.  Ears popping

## 2022-09-29 ENCOUNTER — Telehealth: Payer: Self-pay

## 2022-09-29 DIAGNOSIS — M4317 Spondylolisthesis, lumbosacral region: Secondary | ICD-10-CM

## 2022-09-29 NOTE — Telephone Encounter (Signed)
Patient called wanting to find out if he needs to be referred back out to [facility] to get ESI injections. Please return his call [phone]

## 2022-09-30 ENCOUNTER — Other Ambulatory Visit: Payer: Self-pay | Admitting: Orthopedic Surgery

## 2022-09-30 DIAGNOSIS — M4317 Spondylolisthesis, lumbosacral region: Secondary | ICD-10-CM

## 2022-09-30 NOTE — Telephone Encounter (Signed)
Yes, referral made I will call him and call [facility]

## 2022-09-30 NOTE — Telephone Encounter (Signed)
I called [facility] left message for Juliann Pulse she will call him to schedule. And called patient to advise

## 2022-10-06 ENCOUNTER — Ambulatory Visit
Admission: RE | Admit: 2022-10-06 | Discharge: 2022-10-06 | Disposition: A | Discharge status: Home / Self Care | Payer: 59 | Source: Ambulatory Visit | Attending: Orthopedic Surgery | Admitting: Orthopedic Surgery

## 2022-10-06 DIAGNOSIS — M4317 Spondylolisthesis, lumbosacral region: Secondary | ICD-10-CM

## 2022-10-06 MED ORDER — IOPAMIDOL (ISOVUE-M 200) INJECTION 41%
1.0000 mL | Freq: Once | INTRAMUSCULAR | Status: AC
  Administered 2022-10-06: 1 mL via EPIDURAL

## 2022-10-06 MED ORDER — METHYLPREDNISOLONE ACETATE 40 MG/ML INJ SUSP (RADIOLOG
80.0000 mg | Freq: Once | INTRAMUSCULAR | Status: AC
  Administered 2022-10-06: 80 mg via EPIDURAL

## 2022-10-06 NOTE — Discharge Instructions (Signed)

## 2022-12-09 ENCOUNTER — Encounter: Payer: Self-pay | Admitting: Family Medicine

## 2022-12-09 ENCOUNTER — Ambulatory Visit (INDEPENDENT_AMBULATORY_CARE_PROVIDER_SITE_OTHER): Payer: 59 | Admitting: Family Medicine

## 2022-12-09 VITALS — BP 132/86 | HR 56 | Ht 70.5 in | Wt 195.0 lb

## 2022-12-09 DIAGNOSIS — D696 Thrombocytopenia, unspecified: Secondary | ICD-10-CM

## 2022-12-09 DIAGNOSIS — R002 Palpitations: Secondary | ICD-10-CM

## 2022-12-09 NOTE — Progress Notes (Signed)
   Subjective:    Patient ID: Francisco Hester, male    DOB: July 11, 1961, 62 y.o.   MRN: [id number]  HPI Palpitations after caffeine/ chocolate consumption 4 to 5 days ago  Patient over the weekend had some coffee with caffeine along with chocolates and had intermittent palpitations that came and went for several hours.  Denies any chest pressure tightness or pain.  Denied any radiation down the arms.  Patient runs multiple days per week without difficulty states he can get his heart rate up to 1 5160 without trouble  Review of Systems     Objective:   Physical Exam  General-in no acute distress Eyes-no discharge Lungs-respiratory rate normal, CTA CV-no murmurs,RRR Extremities skin warm dry no edema Neuro grossly normal Behavior normal, alert  EKG no acute changes.  Normal sinus rhythm.  No murmurs heard on exam.     Assessment & Plan:  Palpitations-it is felt that this is related into increased caffeine and chocolate intake.  I do not feel that the patient needs any type of suppressive medicine at this time.  I did encourage him to continue with exercise.  If he has any angina symptoms to report to [place] ASAP As for palpitations if he has ongoing troubles especially if they become more proliferative recommend consultation with cardiology He did do cardiology workup in 2016 Patient is to do his wellness exam in the fall  Patient with history of thrombocytopenia we will check CBC when he does his wellness visit later this year

## 2022-12-10 ENCOUNTER — Other Ambulatory Visit: Payer: Self-pay | Admitting: Family Medicine

## 2022-12-10 DIAGNOSIS — E785 Hyperlipidemia, unspecified: Secondary | ICD-10-CM

## 2022-12-10 DIAGNOSIS — Z0000 Encounter for general adult medical examination without abnormal findings: Secondary | ICD-10-CM

## 2022-12-10 DIAGNOSIS — D696 Thrombocytopenia, unspecified: Secondary | ICD-10-CM

## 2022-12-10 DIAGNOSIS — Z125 Encounter for screening for malignant neoplasm of prostate: Secondary | ICD-10-CM

## 2022-12-10 NOTE — Progress Notes (Signed)
12/10/22-lab orders placed under orders only

## 2023-01-11 ENCOUNTER — Telehealth: Payer: Self-pay | Admitting: Family Medicine

## 2023-01-11 NOTE — Telephone Encounter (Signed)
Front Patient having prostate issues I recommend a follow-up office visit Wednesday morning 9:40 AM patient is aware to come He is to also give a urine specimen when he comes thank you

## 2023-01-13 ENCOUNTER — Ambulatory Visit (INDEPENDENT_AMBULATORY_CARE_PROVIDER_SITE_OTHER): Payer: 59 | Admitting: Family Medicine

## 2023-01-13 VITALS — BP 122/72 | Wt 198.8 lb

## 2023-01-13 DIAGNOSIS — R300 Dysuria: Secondary | ICD-10-CM

## 2023-01-13 LAB — POCT URINALYSIS DIP (CLINITEK)
Blood, UA: NEGATIVE
Spec Grav, UA: 1.02 (ref 1.010–1.025)
pH, UA: 6 (ref 5.0–8.0)

## 2023-01-13 NOTE — Progress Notes (Signed)
   Subjective:    Patient ID: Francisco Hester, male    DOB: 1960-12-30, 62 y.o.   MRN: [id number]  HPI Very nice gentleman Having some intermittent stinging/itchy sensation in the urethra with urination for short span of time to have increased urination now that seems to be doing better flow overall seems to be doing okay denies hematuria denies lower abdominal pain no rectal pain no fever or chills Patient arrives today with prostate issues. Burning, symptoms when patient urinate.     Review of Systems     Objective:   Physical Exam Lungs clear heart regular abdomen is soft prostate exam he does have an enlarged prostate but it is not tender not swollen.  Has normal feel       Assessment & Plan:  Urine culture sent Dysuria symptoms could well be related to prostate Do not find evidence of prostatitis currently Hold off on antibiotics Patient to give [place] feedback within the next week how things are going Also follow-up this summer for wellness and do blood work before that visit I do not find evidence of prostate cancer

## 2023-01-15 LAB — URINE CULTURE: Organism ID, Bacteria: NO GROWTH

## 2023-01-19 ENCOUNTER — Encounter: Payer: Self-pay | Admitting: Family Medicine

## 2023-01-20 ENCOUNTER — Other Ambulatory Visit: Payer: Self-pay | Admitting: Family Medicine

## 2023-01-20 MED ORDER — CIPROFLOXACIN HCL 500 MG PO TABS: 500.0000 mg | ORAL_TABLET | Freq: Two times a day (BID) | ORAL | 0 refills | Status: AC

## 2023-02-04 ENCOUNTER — Encounter: Payer: Self-pay | Admitting: Radiology

## 2023-03-18 ENCOUNTER — Ambulatory Visit (INDEPENDENT_AMBULATORY_CARE_PROVIDER_SITE_OTHER): Payer: 59 | Admitting: Orthopedic Surgery

## 2023-03-18 DIAGNOSIS — M65311 Trigger thumb, right thumb: Secondary | ICD-10-CM

## 2023-03-18 MED ORDER — METHYLPREDNISOLONE ACETATE 40 MG/ML IJ SUSP
40.0000 mg | Freq: Once | INTRAMUSCULAR | Status: AC
  Administered 2023-03-18: 40 mg via INTRA_ARTICULAR

## 2023-03-18 NOTE — Progress Notes (Signed)
Chief Complaint  Patient presents with   Hand Pain    R thumb popping had come back and gotten worse over the past 6 wks.    62 yo male h/o right thumb triggering in 2023   Injection given in August   C/o recurrence   (53/6446) 62 year old male new complaint of pain and catching right thumb he also has some palpable cords in his right and left palm and says that his father had the same thing when he was older   Right Hand Exam   Tenderness  The patient is experiencing tenderness in the palmar area.  Range of Motion  Hand  MP Thumb: normal   Muscle Strength  Grip: 5/5   Other  Erythema: absent Sensation: normal Pulse: present      Right Trigger thumb injection Medication  1 mL of 40 mg Depo-Medrol  2 mL of 1% lidocaine plain  Ethyl chloride for anesthesia  Verbal consent was obtained timeout was taken to confirm the injection site as right thumb  Alcohol was used to prepare the skin along with ethyl chloride and then the injection was made at the A1 pulley there were no complications   Return as needed

## 2023-04-19 ENCOUNTER — Telehealth: Payer: Self-pay

## 2023-04-19 ENCOUNTER — Other Ambulatory Visit: Payer: Self-pay | Admitting: Orthopedic Surgery

## 2023-04-19 DIAGNOSIS — M4317 Spondylolisthesis, lumbosacral region: Secondary | ICD-10-CM

## 2023-04-19 NOTE — Telephone Encounter (Signed)
Called [facility] to advise order placed Left message for Olegario Messier asked her to call him to schedule.

## 2023-04-19 NOTE — Telephone Encounter (Signed)
Patient left message asking for Dr. Romeo Hester to send an order to [facility] so he can get another back injection. Last seen for back on 09/22/21. Does he need to be seen again before an order can be sent to them or not.  Please advise

## 2023-04-22 ENCOUNTER — Ambulatory Visit
Admission: RE | Admit: 2023-04-22 | Discharge: 2023-04-22 | Disposition: A | Discharge status: Home / Self Care | Payer: 59 | Source: Ambulatory Visit | Attending: Orthopedic Surgery | Admitting: Orthopedic Surgery

## 2023-04-22 DIAGNOSIS — M4317 Spondylolisthesis, lumbosacral region: Secondary | ICD-10-CM

## 2023-04-22 MED ORDER — IOPAMIDOL (ISOVUE-M 200) INJECTION 41%
1.0000 mL | Freq: Once | INTRAMUSCULAR | Status: AC
  Administered 2023-04-22: 1 mL via EPIDURAL

## 2023-04-22 MED ORDER — METHYLPREDNISOLONE ACETATE 40 MG/ML INJ SUSP (RADIOLOG
80.0000 mg | Freq: Once | INTRAMUSCULAR | Status: AC
  Administered 2023-04-22: 80 mg via EPIDURAL

## 2023-04-22 NOTE — Discharge Instructions (Signed)

## 2023-05-04 ENCOUNTER — Encounter: Payer: Self-pay | Admitting: Family Medicine

## 2023-05-13 LAB — PSA: Prostate Specific Ag, Serum: 2.4 ng/mL (ref 0.0–4.0)

## 2023-05-13 LAB — CBC WITH DIFFERENTIAL/PLATELET
Basophils Absolute: 0 10*3/uL (ref 0.0–0.2)
Basos: 1 %
EOS (ABSOLUTE): 0.1 10*3/uL (ref 0.0–0.4)
Eos: 2 %
Hematocrit: 47.2 % (ref 37.5–51.0)
Hemoglobin: 16.1 g/dL (ref 13.0–17.7)
Immature Grans (Abs): 0 10*3/uL (ref 0.0–0.1)
Immature Granulocytes: 0 %
Lymphocytes Absolute: 1.4 10*3/uL (ref 0.7–3.1)
Lymphs: 36 %
MCH: 30.9 pg (ref 26.6–33.0)
MCHC: 34.1 g/dL (ref 31.5–35.7)
MCV: 91 fL (ref 79–97)
Monocytes Absolute: 0.3 10*3/uL (ref 0.1–0.9)
Monocytes: 7 %
Neutrophils Absolute: 2.1 10*3/uL (ref 1.4–7.0)
Neutrophils: 54 %
Platelets: 135 10*3/uL — ABNORMAL LOW (ref 150–450)
RBC: 5.21 x10E6/uL (ref 4.14–5.80)
RDW: 13.2 % (ref 11.6–15.4)
WBC: 3.8 10*3/uL (ref 3.4–10.8)

## 2023-05-13 LAB — HEPATIC FUNCTION PANEL
ALT: 23 IU/L (ref 0–44)
AST: 28 IU/L (ref 0–40)
Albumin: 4.7 g/dL (ref 3.9–4.9)
Alkaline Phosphatase: 71 IU/L (ref 44–121)
Bilirubin Total: 0.5 mg/dL (ref 0.0–1.2)
Bilirubin, Direct: 0.12 mg/dL (ref 0.00–0.40)
Total Protein: 6.7 g/dL (ref 6.0–8.5)

## 2023-05-13 LAB — BASIC METABOLIC PANEL
BUN/Creatinine Ratio: 14 (ref 10–24)
BUN: 15 mg/dL (ref 8–27)
CO2: 25 mmol/L (ref 20–29)
Calcium: 9.9 mg/dL (ref 8.6–10.2)
Chloride: 101 mmol/L (ref 96–106)
Creatinine, Ser: 1.06 mg/dL (ref 0.76–1.27)
Glucose: 87 mg/dL (ref 70–99)
Potassium: 4.3 mmol/L (ref 3.5–5.2)
Sodium: 142 mmol/L (ref 134–144)
eGFR: 79 mL/min/{1.73_m2} (ref >59)

## 2023-05-13 LAB — LIPID PANEL
Chol/HDL Ratio: 3.9 ratio (ref 0.0–5.0)
Cholesterol, Total: 191 mg/dL (ref 100–199)
HDL: 49 mg/dL (ref >39)
LDL Chol Calc (NIH): 119 mg/dL — ABNORMAL HIGH (ref 0–99)
Triglycerides: 129 mg/dL (ref 0–149)
VLDL Cholesterol Cal: 23 mg/dL (ref 5–40)

## 2023-05-17 ENCOUNTER — Ambulatory Visit (INDEPENDENT_AMBULATORY_CARE_PROVIDER_SITE_OTHER): Payer: 59 | Admitting: Family Medicine

## 2023-05-17 ENCOUNTER — Encounter: Payer: Self-pay | Admitting: Family Medicine

## 2023-05-17 VITALS — BP 136/86 | HR 55 | Ht 72.25 in | Wt 191.2 lb

## 2023-05-17 DIAGNOSIS — D692 Other nonthrombocytopenic purpura: Secondary | ICD-10-CM

## 2023-05-17 DIAGNOSIS — Z0001 Encounter for general adult medical examination with abnormal findings: Secondary | ICD-10-CM | POA: Diagnosis not present

## 2023-05-17 DIAGNOSIS — Z0000 Encounter for general adult medical examination without abnormal findings: Secondary | ICD-10-CM

## 2023-05-17 DIAGNOSIS — E785 Hyperlipidemia, unspecified: Secondary | ICD-10-CM | POA: Diagnosis not present

## 2023-05-17 DIAGNOSIS — D696 Thrombocytopenia, unspecified: Secondary | ICD-10-CM

## 2023-05-17 NOTE — Progress Notes (Signed)
Subjective:    Patient ID: Francisco Hester, male    DOB: 09/25/1961, 62 y.o.   MRN: [id number]  HPI The patient comes in today for a wellness visit.    A review of their health history was completed.  A review of medications was also completed.  Any needed refills; no  Eating habits: great (eating healthy)  Falls/  MVA accidents in past few months: no  Regular exercise: yes, run and walk 5 days a week and lift weights 3 days a week.  Specialist pt sees on regular basis: orthopedic Dr Romeo Apple   Preventative health issues were discussed.   Additional concerns: No concerns or issues today   Past Medical History:  Diagnosis Date   Hemorrhoids    Low back pain     Outpatient Encounter Medications as of 05/17/2023  Medication Sig   Biotin 1 MG CAPS Take 1 tablet by mouth daily.   Calcium 250 MG CAPS Take by mouth.   Cholecalciferol (VITAMIN D PO) Take by mouth.   Ascorbic Acid (VITAMIN C PO) Take by mouth. (Patient not taking: Reported on 05/17/2023)   cetirizine (ZYRTEC ALLERGY) 10 MG tablet Take 1 tablet (10 mg total) by mouth daily. (Patient not taking: Reported on 05/17/2023)   Omega-3 Fatty Acids (FISH OIL PO) Take by mouth. (Patient not taking: Reported on 05/17/2023)   No facility-administered encounter medications on file as of 05/17/2023.    Review of Systems     Objective:   Physical Exam General-in no acute distress Eyes-no discharge Lungs-respiratory rate normal, CTA CV-no murmurs,RRR Extremities skin warm dry no edema Neuro grossly normal Behavior normal, alert The 10-year ASCVD risk score (Arnett DK, et al., 2019) is: 11.8%   Values used to calculate the score:     Age: 2 years     Sex: Male     Is Non-Hispanic African American: No     Diabetic: No     Tobacco smoker: No     Systolic Blood Pressure: 136 mmHg     Is BP treated: No     HDL Cholesterol: 49 mg/dL     Total Cholesterol: 191 mg/dL Senile purpura right hand Patient had blood  pressure elevated initially but on recheck came back looking good Patient had prostate exam on last visit enlarged soft no nodules    Assessment & Plan:   1. Well adult exam Adult wellness-complete.wellness physical was conducted today. Importance of diet and exercise were discussed in detail.  Importance of stress reduction and healthy living were discussed.  In addition to this a discussion regarding safety was also covered.  We also reviewed over immunizations and gave recommendations regarding current immunization needed for age.   In addition to this additional areas were also touched on including: Preventative health exams needed:  Colonoscopy August 2022-next 07 July 2026  Patient was advised yearly wellness exam   2. Thrombocytopenia (HCC) Recent bruising to the right hand but otherwise does not have bleeding issues platelet counts have been in the upper range of lower readings for years not getting worse If bleeding issues or if trending downward or getting worse we will refer to hematology but currently unlikely that there would be any dramatic findings presently  3. Hyperlipidemia, unspecified hyperlipidemia type LDL slightly elevated HDL good patient does not want to be on statins We did discuss coronary calcium scan patient defers currently information was given he will let us know  4. Senile purpura (HCC) Noted on the right  hand not severe  Information given regarding shingles vaccine Patient has a good job of eating healthy for the most part exercises on a regular basis

## 2023-06-09 ENCOUNTER — Encounter: Payer: Self-pay | Admitting: Family Medicine

## 2023-06-09 NOTE — Telephone Encounter (Signed)
Nurses I would recommend FODMAP diet I can print this out over the next couple days the patient could pick this up Friday afternoon if Kathlene November continues to have trouble we would recommend an office visit for further discussion and laying out what tests would be beneficial and potential referrals as well thank you

## 2023-06-15 ENCOUNTER — Encounter: Payer: Self-pay | Admitting: Family Medicine

## 2023-06-22 ENCOUNTER — Ambulatory Visit (INDEPENDENT_AMBULATORY_CARE_PROVIDER_SITE_OTHER): Payer: 59 | Admitting: Family Medicine

## 2023-06-22 VITALS — BP 132/82 | HR 70 | Wt 196.6 lb

## 2023-06-22 DIAGNOSIS — R195 Other fecal abnormalities: Secondary | ICD-10-CM

## 2023-06-22 DIAGNOSIS — R143 Flatulence: Secondary | ICD-10-CM | POA: Diagnosis not present

## 2023-06-22 NOTE — Progress Notes (Signed)
   Subjective:    Patient ID: Francisco Hester, male    DOB: [birth year], 62 y.o.   MRN: [id number]  HPI Patient arrives today with a lot of gas issues.  The 10-year ASCVD risk score (Arnett DK, et al., 2019) is: 11.3%   Values used to calculate the score:     Age: 62 years     Sex: Male     Is Non-Hispanic African American: No     Diabetic: No     Tobacco smoker: No     Systolic Blood Pressure: 132 mmHg     Is BP treated: No     HDL Cholesterol: 49 mg/dL     Total Cholesterol: 191 mg/dL  Very nice patient Having increased gas and some intermittent abdominal sensations.  Denies true abdominal pain He denies dysuria urinary frequency hematuria or hematochezia He does state he has had mucousy stools ever since being on antibiotics recently States the problems seem to occur around the time he was on antibiotics for a prostate infection as well as a dental infection Denies any unusual sources of water or other issues  Review of Systems     Objective:   Physical Exam General-in no acute distress Eyes-no discharge Lungs-respiratory rate normal, CTA CV-no murmurs,RRR Extremities skin warm dry no edema Neuro grossly normal Behavior normal, alert        Assessment & Plan:   Mucus in the stool Increased gas Rule out C. difficile Antibiotics avoid Lab work ordered for celiac disease as well as C. difficile May need gastroenterology consult depending on results Healthy diet currently taking probiotics which is reasonable

## 2023-06-23 LAB — COMPREHENSIVE METABOLIC PANEL
ALT: 20 IU/L (ref 0–44)
AST: 22 IU/L (ref 0–40)
Albumin: 4.5 g/dL (ref 3.9–4.9)
Alkaline Phosphatase: 77 IU/L (ref 44–121)
BUN/Creatinine Ratio: 13 (ref 10–24)
BUN: 15 mg/dL (ref 8–27)
Bilirubin Total: 0.3 mg/dL (ref 0.0–1.2)
CO2: 27 mmol/L (ref 20–29)
Calcium: 9.5 mg/dL (ref 8.6–10.2)
Chloride: 99 mmol/L (ref 96–106)
Creatinine, Ser: 1.16 mg/dL (ref 0.76–1.27)
Globulin, Total: 1.9 g/dL (ref 1.5–4.5)
Glucose: 78 mg/dL (ref 70–99)
Potassium: 4.1 mmol/L (ref 3.5–5.2)
Sodium: 141 mmol/L (ref 134–144)
Total Protein: 6.4 g/dL (ref 6.0–8.5)
eGFR: 71 mL/min/{1.73_m2} (ref >59)

## 2023-06-23 LAB — C-REACTIVE PROTEIN: CRP: <1 mg/L (ref 0–10)

## 2023-06-23 LAB — TISSUE TRANSGLUTAMINASE ABS,IGG,IGA
Tissue Transglut Ab: 4 U/mL (ref 0–5)
Transglutaminase IgA: <2 U/mL (ref 0–3)

## 2023-06-25 LAB — C DIFFICILE, CYTOTOXIN B

## 2023-06-26 LAB — C DIFFICILE TOXINS A+B W/RFLX: C difficile Toxins A+B, EIA: NEGATIVE

## 2023-06-28 LAB — C DIFFICILE, CYTOTOXIN B

## 2023-07-15 ENCOUNTER — Encounter: Payer: Self-pay | Admitting: Family Medicine

## 2023-07-15 DIAGNOSIS — R143 Flatulence: Secondary | ICD-10-CM

## 2023-07-15 DIAGNOSIS — R195 Other fecal abnormalities: Secondary | ICD-10-CM

## 2023-07-15 NOTE — Telephone Encounter (Signed)
Nurses I was able to review the note that [facility] sent as well as his previous labs and office visit.  [facility] further consultation

## 2023-07-26 ENCOUNTER — Encounter: Payer: Self-pay | Admitting: Family Medicine

## 2023-07-26 ENCOUNTER — Telehealth: Payer: Self-pay | Admitting: Gastroenterology

## 2023-07-26 NOTE — Telephone Encounter (Signed)
The pt has been advised that he was speaking with Dr Fletcher Anon office not our office. He will call that office now.

## 2023-07-26 NOTE — Telephone Encounter (Signed)
Inbound call from patient stating he spoke with a nurse last week and was advised he would be receiving information for a probiotic. Patient requesting a call back. Please advise, thank you.

## 2023-08-06 IMAGING — XA Imaging study
2 series · 2 of 2 positions shown · non-contrast
Comparison: none

CLINICAL DATA: Lumbosacral spondylosis without myelopathy. Positive
response to multiple prior epidural injections. Recurrent left
greater than right low back and buttock pain

[Series 1: ortho adipose · 1 of 1 slices shown (1 of 2)]
[im 1/1]
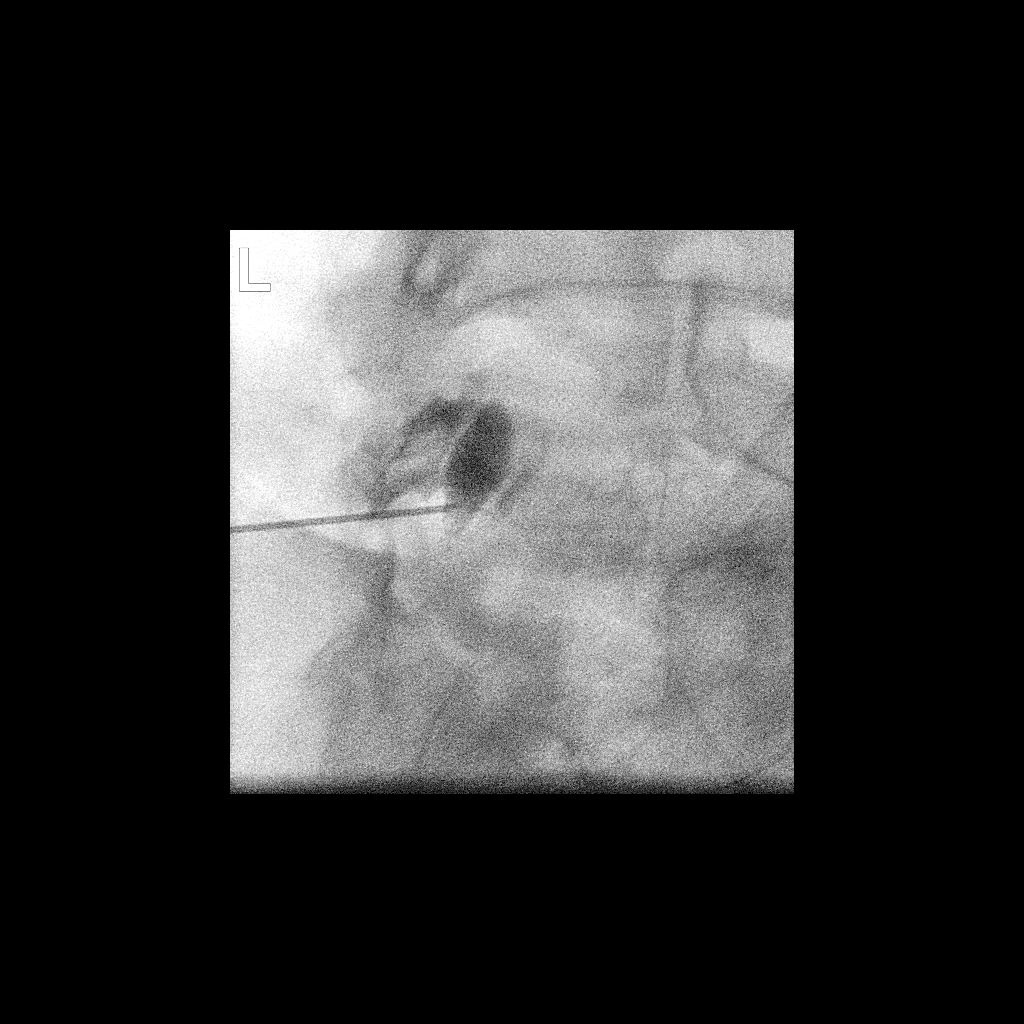

[Series 2: ortho adipose · 1 of 1 slices shown (2 of 2)]
[im 1/1]
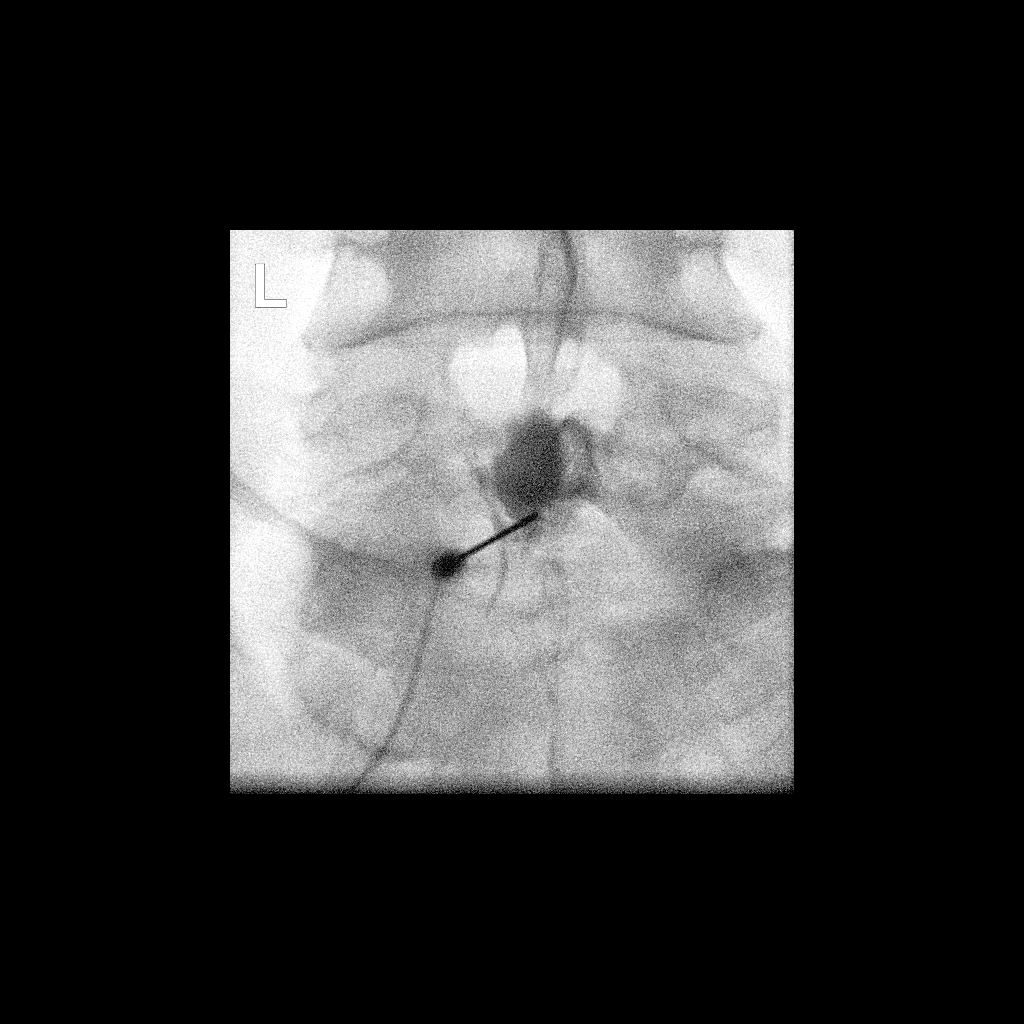

[2 of 2 positions shown; findings below may reference images not displayed]

FLUOROSCOPY TIME:  Fluoroscopy Time: 9 seconds

Radiation Exposure Index: 7.22 microGray*m^2

PROCEDURE:
The procedure, risks, benefits, and alternatives were explained to
the patient. Questions regarding the procedure were encouraged and
answered. The patient understands and consents to the procedure.

LUMBAR EPIDURAL INJECTION:

An interlaminar approach was performed on the left at L5-S1. The
overlying skin was cleansed and anesthetized. A 3.5 inch 20 gauge
epidural needle was advanced using loss-of-resistance technique.

DIAGNOSTIC EPIDURAL INJECTION:

Injection of Isovue-M 200 shows a good epidural pattern with spread
above and below the level of needle placement, primarily on the
left. No vascular opacification is seen.

THERAPEUTIC EPIDURAL INJECTION:

80 mg of Depo-Medrol mixed with 3 mL of 1% lidocaine were instilled.
The procedure was well-tolerated, and the patient was discharged
thirty minutes following the injection in good condition.

COMPLICATIONS:
None
IMPRESSION: Technically successful interlaminar epidural injection on the left
at L5-S1.

## 2023-10-06 ENCOUNTER — Ambulatory Visit (INDEPENDENT_AMBULATORY_CARE_PROVIDER_SITE_OTHER): Payer: 59 | Admitting: Gastroenterology

## 2023-10-06 ENCOUNTER — Encounter: Payer: Self-pay | Admitting: Gastroenterology

## 2023-10-06 VITALS — BP 130/80 | HR 79 | Ht 72.0 in

## 2023-10-06 DIAGNOSIS — Z860100 Personal history of colon polyps, unspecified: Secondary | ICD-10-CM

## 2023-10-06 DIAGNOSIS — R194 Change in bowel habit: Secondary | ICD-10-CM

## 2023-10-06 DIAGNOSIS — R143 Flatulence: Secondary | ICD-10-CM

## 2023-10-06 NOTE — Patient Instructions (Signed)
Please purchase the following medications over the counter and take as directed: Gas-X to take four times a day as needed for gas and bloating.   You have been given a low gas diet.   The [facility] providers would like to encourage you to use [facility] to communicate with providers for non-urgent requests or questions.  Due to long hold times on the telephone, sending your provider a message by [facility] may be a faster and more efficient way to get a response.  Please allow 48 business hours for a response.  Please remember that this is for non-urgent requests.   Thank you for choosing me and [facility].  Venita Lick. Pleas Koch., MD., Clementeen Graham

## 2023-10-06 NOTE — Progress Notes (Signed)
    Assessment     Intestinal gas, change in bowel habits secondary to antibiotics Personal history of sessile serrated colon polyps Mild thrombocytopenia   Recommendations    Gas-X qid prn, low gas diet, contact us if symptoms not improving Surveillance colonoscopy recommended in August 2027 REV prn   HPI    This is a 62 year old male who relates a change in bowel habits with looser stools and increased intestinal gas.  His symptoms began while on antibiotics for prostate infection and then a dental infection this summer.  His looser stools have significantly improved - now formed, once daily.  He notes persistent problems with intestinal gas that typically follows meals.  He has used Gas-X with good but temporary relief of symptoms.  In July, C. difficile was negative, tTG was normal.  CMP and CRP were also normal.  Recent colonoscopy as below. Denies weight loss, abdominal pain, constipation, change in stool caliber, melena, hematochezia, nausea, vomiting, dysphagia, reflux symptoms, chest pain.   Colonoscopy Aug 2022 - Four 5 to 7 mm polyps in the sigmoid colon, in the transverse colon and in the ascending colon, removed with a cold snare. Resected and retrieved.  - Internal hemorrhoids.  - The examination was otherwise normal on direct and retroflexion views Path: 1SSP, HPs   Labs / Imagi ng       Latest Ref Rng & Units 06/22/2023    4:15 PM 05/12/2023    7:43 AM 05/05/2022    8:28 AM  Hepatic Function  Total Protein 6.0 - 8.5 g/dL 6.4  6.7  6.5   Albumin 3.9 - 4.9 g/dL 4.5  4.7  4.7   AST 0 - 40 IU/L 22  28  23    ALT 0 - 44 IU/L 20  23  23    Alk Phosphatase 44 - 121 IU/L 77  71  73   Total Bilirubin 0.0 - 1.2 mg/dL 0.3  0.5  0.6   Bilirubin, Direct 0.00 - 0.40 mg/dL  2.72  5.36        Latest Ref Rng & Units 05/12/2023    7:43 AM 05/05/2022    8:28 AM 04/30/2021    7:57 AM  CBC  WBC 3.4 - 10.8 x10E3/uL 3.8  3.5  4.1   Hemoglobin 13.0 - 17.7 g/dL 64.4  03.4  74.2    Hematocrit 37.5 - 51.0 % 47.2  48.9  46.3   Platelets 150 - 450 x10E3/uL 135  147  147     Current Medications, Allergies, Past Medical History, Past Surgical History, Family History and Social History were reviewed in [employer] record.   Physical Exam: General: Well developed, well nourished, no acute distress Head: Normocephalic and atraumatic Eyes: Sclerae anicteric, EOMI Ears: Normal auditory acuity Mouth: No deformities or lesions noted Lungs: Clear throughout to auscultation Heart: Regular rate and rhythm; No murmurs, rubs or bruits Abdomen: Soft, non tender and non distended. No masses, hepatosplenomegaly or hernias noted. Normal Bowel sounds Rectal: Not done Musculoskeletal: Symmetrical with no gross deformities  Pulses:  Normal pulses noted Extremities: No edema or deformities noted Neurological: Alert oriented x 4, grossly nonfocal Psychological:  Alert and cooperative. Normal mood and affect   Abagael Kramm T. Russella Dar, MD 10/06/2023, 1:45 PM

## 2023-11-02 ENCOUNTER — Telehealth: Payer: Self-pay | Admitting: Orthopedic Surgery

## 2023-11-02 DIAGNOSIS — M4317 Spondylolisthesis, lumbosacral region: Secondary | ICD-10-CM

## 2023-11-02 NOTE — Telephone Encounter (Signed)
Dr. Mort Sawyers pt - pt lvm stating that he would like to get an injection in his back and he normally gets this from [facility].  His back is starting to give him problems again.  [phone]

## 2023-11-03 ENCOUNTER — Other Ambulatory Visit: Payer: Self-pay | Admitting: Orthopedic Surgery

## 2023-11-03 DIAGNOSIS — M4317 Spondylolisthesis, lumbosacral region: Secondary | ICD-10-CM

## 2023-11-03 NOTE — Telephone Encounter (Signed)
Sent per Dr Romeo Apple He can call to schedule [phone]

## 2023-11-03 NOTE — Telephone Encounter (Signed)
Called [name] left message for Francisco Hester to advise referral sent Called patient to advise him to call to schedule

## 2023-11-15 NOTE — Discharge Instructions (Signed)

## 2023-11-16 ENCOUNTER — Inpatient Hospital Stay
Admission: RE | Admit: 2023-11-16 | Discharge: 2023-11-16 | Disposition: A | Discharge status: Home / Self Care | Payer: 59 | Source: Ambulatory Visit | Attending: Orthopedic Surgery | Admitting: Orthopedic Surgery

## 2023-11-16 DIAGNOSIS — M4317 Spondylolisthesis, lumbosacral region: Secondary | ICD-10-CM

## 2023-11-16 MED ORDER — IOPAMIDOL (ISOVUE-M 200) INJECTION 41%
1.0000 mL | Freq: Once | INTRAMUSCULAR | Status: AC
  Administered 2023-11-16: 1 mL via EPIDURAL

## 2023-11-16 MED ORDER — METHYLPREDNISOLONE ACETATE 40 MG/ML INJ SUSP (RADIOLOG
80.0000 mg | Freq: Once | INTRAMUSCULAR | Status: AC
  Administered 2023-11-16: 80 mg via EPIDURAL

## 2024-01-24 ENCOUNTER — Encounter: Payer: Self-pay | Admitting: Orthopedic Surgery

## 2024-01-24 ENCOUNTER — Ambulatory Visit (INDEPENDENT_AMBULATORY_CARE_PROVIDER_SITE_OTHER): Payer: 59 | Admitting: Orthopedic Surgery

## 2024-01-24 DIAGNOSIS — M65311 Trigger thumb, right thumb: Secondary | ICD-10-CM | POA: Diagnosis not present

## 2024-01-24 MED ORDER — METHYLPREDNISOLONE ACETATE 40 MG/ML IJ SUSP
40.0000 mg | Freq: Once | INTRAMUSCULAR | Status: AC
  Administered 2024-01-24: 40 mg via INTRA_ARTICULAR

## 2024-01-24 NOTE — Progress Notes (Signed)
Chief Complaint  Patient presents with   Injections    Right thumb triggering     Encounter Diagnosis  Name Primary?   Trigger thumb, right thumb Yes     Right Trigger thumb injection Medication  1 mL of 40 mg Depo-Medrol  2 mL of 1% lidocaine plain  Ethyl chloride for anesthesia  Verbal consent was obtained timeout was taken to confirm the injection site as right thumb  Alcohol was used to prepare the skin along with ethyl chloride and then the injection was made at the A1 pulley there were no complications

## 2024-05-02 ENCOUNTER — Encounter: Payer: Self-pay | Admitting: Family Medicine

## 2024-05-02 ENCOUNTER — Telehealth: Payer: Self-pay | Admitting: Family Medicine

## 2024-05-02 NOTE — Telephone Encounter (Signed)
 Nurses-please put an order for CBC, lipid, liver, metabolic 7, PSA  Diagnosis wellness, screening prostate cancer, hyperlipidemia  Patient is aware to get these completed

## 2024-05-03 ENCOUNTER — Other Ambulatory Visit: Payer: Self-pay

## 2024-05-03 DIAGNOSIS — R7989 Other specified abnormal findings of blood chemistry: Secondary | ICD-10-CM

## 2024-05-03 DIAGNOSIS — E785 Hyperlipidemia, unspecified: Secondary | ICD-10-CM

## 2024-05-03 DIAGNOSIS — N400 Enlarged prostate without lower urinary tract symptoms: Secondary | ICD-10-CM

## 2024-05-03 DIAGNOSIS — Z0000 Encounter for general adult medical examination without abnormal findings: Secondary | ICD-10-CM

## 2024-05-09 ENCOUNTER — Ambulatory Visit: Payer: Self-pay | Admitting: Family Medicine

## 2024-05-09 LAB — CBC
Hematocrit: 49.4 % (ref 37.5–51.0)
Hemoglobin: 16.9 g/dL (ref 13.0–17.7)
MCH: 31.7 pg (ref 26.6–33.0)
MCHC: 34.2 g/dL (ref 31.5–35.7)
MCV: 93 fL (ref 79–97)
Platelets: 143 10*3/uL — ABNORMAL LOW (ref 150–450)
RBC: 5.33 x10E6/uL (ref 4.14–5.80)
RDW: 13.3 % (ref 11.6–15.4)
WBC: 3.5 10*3/uL (ref 3.4–10.8)

## 2024-05-09 LAB — HEPATIC FUNCTION PANEL
ALT: 22 IU/L (ref 0–44)
AST: 26 IU/L (ref 0–40)
Albumin: 4.7 g/dL (ref 3.9–4.9)
Alkaline Phosphatase: 74 IU/L (ref 44–121)
Bilirubin Total: 0.6 mg/dL (ref 0.0–1.2)
Bilirubin, Direct: 0.17 mg/dL (ref 0.00–0.40)
Total Protein: 6.6 g/dL (ref 6.0–8.5)

## 2024-05-09 LAB — BASIC METABOLIC PANEL WITH GFR
BUN/Creatinine Ratio: 14 (ref 10–24)
BUN: 16 mg/dL (ref 8–27)
CO2: 24 mmol/L (ref 20–29)
Calcium: 10.2 mg/dL (ref 8.6–10.2)
Chloride: 100 mmol/L (ref 96–106)
Creatinine, Ser: 1.16 mg/dL (ref 0.76–1.27)
Glucose: 93 mg/dL (ref 70–99)
Potassium: 4.2 mmol/L (ref 3.5–5.2)
Sodium: 139 mmol/L (ref 134–144)
eGFR: 70 mL/min/{1.73_m2} (ref >59)

## 2024-05-09 LAB — LIPID PANEL
Chol/HDL Ratio: 4.8 ratio (ref 0.0–5.0)
Cholesterol, Total: 203 mg/dL — ABNORMAL HIGH (ref 100–199)
HDL: 42 mg/dL (ref >39)
LDL Chol Calc (NIH): 134 mg/dL — ABNORMAL HIGH (ref 0–99)
Triglycerides: 148 mg/dL (ref 0–149)
VLDL Cholesterol Cal: 27 mg/dL (ref 5–40)

## 2024-05-09 LAB — PSA: Prostate Specific Ag, Serum: 2.6 ng/mL (ref 0.0–4.0)

## 2024-05-17 ENCOUNTER — Ambulatory Visit: Payer: 59 | Admitting: Family Medicine

## 2024-05-17 VITALS — BP 150/90 | HR 60 | Temp 97.3°F | Ht 72.0 in | Wt 190.2 lb

## 2024-05-17 DIAGNOSIS — E785 Hyperlipidemia, unspecified: Secondary | ICD-10-CM

## 2024-05-17 DIAGNOSIS — D696 Thrombocytopenia, unspecified: Secondary | ICD-10-CM | POA: Diagnosis not present

## 2024-05-17 DIAGNOSIS — Z0001 Encounter for general adult medical examination with abnormal findings: Secondary | ICD-10-CM | POA: Diagnosis not present

## 2024-05-17 DIAGNOSIS — Z0000 Encounter for general adult medical examination without abnormal findings: Secondary | ICD-10-CM

## 2024-05-17 NOTE — Progress Notes (Signed)
   Subjective:    Patient ID: Francisco HUSTEAD, male    DOB: [birth year], 63 y.o.   MRN: [id number]  HPI The patient comes in today for a wellness visit.   Here today for wellness Eating healthy Staying active States he could be doing better on dietary  Denies any chest tightness pressure pain or shortness of breath A review of their health history was completed.  A review of medications was also completed.  Any needed refills; none  Eating habits: Overall fairly healthy but states he could do better  Falls/  MVA accidents in past few months: No accidents or injuries generally a safe person  Regular exercise: Does a lot of jogging as well as strength training  Specialist pt sees on regular basis: None  Preventative health issues were discussed.   Additional concerns: None   The 10-year ASCVD risk score (Arnett DK, et al., 2019) is: 17.1%   Values used to calculate the score:     Age: 63 years     Sex: Male     Is Non-Hispanic African American: No     Diabetic: No     Tobacco smoker: No     Systolic Blood Pressure: 150 mmHg     Is BP treated: No     HDL Cholesterol: 42 mg/dL     Total Cholesterol: 203 mg/dL    Review of Systems     Objective:   Physical Exam  General-in no acute distress Eyes-no discharge Lungs-respiratory rate normal, CTA CV-no murmurs,RRR Extremities skin warm dry no edema Neuro grossly normal Behavior normal, alert   Prostate exam normal soft    Assessment & Plan:  1. Well adult exam (Primary) Adult wellness-complete.wellness physical was conducted today. Importance of diet and exercise were discussed in detail.  Importance of stress reduction and healthy living were discussed.  In addition to this a discussion regarding safety was also covered.  We also reviewed over immunizations and gave recommendations regarding current immunization needed for age.   In addition to this additional areas were also touched on  including: Preventative health exams needed:  Colonoscopy 2027  Patient was advised yearly wellness exam   2. Hyperlipidemia, unspecified hyperlipidemia type Continue healthy eating-did discuss with patient increase of his ascvd score Patient to work a little harder on this Recheck lipid profile within 6 months We did discuss statins also discussed coronary calcium patient defers on both He would prefer healthy eating regular activity Repeat lipid profile 6 months 3. Thrombocytopenia (HCC) Platelets slightly low this has been consistent over the past few years will monitor

## 2024-05-17 NOTE — Addendum Note (Signed)
 Addended byHugo Maes on: 05/17/2024 01:34 PM   Modules accepted: Orders

## 2024-08-28 ENCOUNTER — Telehealth: Payer: Self-pay | Admitting: Orthopedic Surgery

## 2024-08-28 DIAGNOSIS — M4317 Spondylolisthesis, lumbosacral region: Secondary | ICD-10-CM

## 2024-08-28 NOTE — Telephone Encounter (Addendum)
 I called him And I called  [facility] could not leave message I got a recording but was not able to leave a message.

## 2024-08-28 NOTE — Telephone Encounter (Signed)
 Dr. Areatha pt - pt lvm stating that he has been going to [facility] for injections for his lower back, the last one was Nov or Dec 2024.  He would like to see if he qualifies to go back there.  [phone]

## 2024-08-28 NOTE — Telephone Encounter (Signed)
 Put in referral  Will call them and him

## 2024-08-29 ENCOUNTER — Other Ambulatory Visit: Payer: Self-pay | Admitting: Orthopedic Surgery

## 2024-08-29 DIAGNOSIS — M4317 Spondylolisthesis, lumbosacral region: Secondary | ICD-10-CM

## 2024-08-29 NOTE — Telephone Encounter (Signed)
 I called [facility] for [facility] services about referral

## 2024-09-08 ENCOUNTER — Other Ambulatory Visit

## 2024-09-14 NOTE — Discharge Instructions (Signed)

## 2024-09-15 ENCOUNTER — Inpatient Hospital Stay
Admission: RE | Admit: 2024-09-15 | Discharge: 2024-09-15 | Disposition: A | Discharge status: Home / Self Care | Source: Ambulatory Visit | Attending: Orthopedic Surgery | Admitting: Orthopedic Surgery

## 2024-09-18 ENCOUNTER — Ambulatory Visit
Admission: RE | Admit: 2024-09-18 | Discharge: 2024-09-18 | Disposition: A | Source: Ambulatory Visit | Attending: Orthopedic Surgery | Admitting: Orthopedic Surgery

## 2024-09-18 DIAGNOSIS — M4317 Spondylolisthesis, lumbosacral region: Secondary | ICD-10-CM

## 2024-09-18 MED ORDER — IOPAMIDOL (ISOVUE-M 200) INJECTION 41%
1.0000 mL | Freq: Once | INTRAMUSCULAR | Status: AC
  Administered 2024-09-18: 1 mL via EPIDURAL

## 2024-09-18 MED ORDER — METHYLPREDNISOLONE ACETATE 40 MG/ML INJ SUSP (RADIOLOG
80.0000 mg | Freq: Once | INTRAMUSCULAR | Status: AC
  Administered 2024-09-18: 80 mg via EPIDURAL

## 2024-09-18 NOTE — Discharge Instructions (Signed)

## 2024-09-29 ENCOUNTER — Other Ambulatory Visit
